# Patient Record
Sex: Female | Born: 1985 | Race: White | Hispanic: No | Marital: Married | Smoking: Never smoker
Health system: Southern US, Community
[De-identification: ages and names within clinical notes are randomized; demographics above are authoritative.]

## PROBLEM LIST (undated history)

## (undated) ENCOUNTER — Emergency Department: Admission: EM | Payer: BLUE CROSS/BLUE SHIELD | Source: Home / Self Care

## (undated) HISTORY — PX: BREAST REDUCTION SURGERY: SHX8

---

## 2008-01-09 ENCOUNTER — Emergency Department (HOSPITAL_COMMUNITY): Admission: EM | Admit: 2008-01-09 | Discharge: 2008-01-09 | Payer: Self-pay | Admitting: Emergency Medicine

## 2009-08-07 ENCOUNTER — Inpatient Hospital Stay (HOSPITAL_COMMUNITY): Admission: RE | Admit: 2009-08-07 | Discharge: 2009-08-10 | Payer: Self-pay | Admitting: Certified Nurse Midwife

## 2009-11-10 ENCOUNTER — Emergency Department (HOSPITAL_COMMUNITY): Admission: EM | Admit: 2009-11-10 | Discharge: 2009-11-11 | Payer: Self-pay | Admitting: Emergency Medicine

## 2010-09-28 LAB — CBC
HCT: 28.3 % — ABNORMAL LOW (ref 36.0–46.0)
HCT: 36.1 % (ref 36.0–46.0)
Hemoglobin: 12.4 g/dL (ref 12.0–15.0)
Hemoglobin: 9.5 g/dL — ABNORMAL LOW (ref 12.0–15.0)
MCV: 91.5 fL (ref 78.0–100.0)
Platelets: 137 10*3/uL — ABNORMAL LOW (ref 150–400)
Platelets: 180 10*3/uL (ref 150–400)
RDW: 13.7 % (ref 11.5–15.5)

## 2010-09-28 LAB — RPR: RPR Ser Ql: NONREACTIVE

## 2010-09-29 LAB — URINALYSIS, ROUTINE W REFLEX MICROSCOPIC
Leukocytes, UA: NEGATIVE
Nitrite: NEGATIVE
Specific Gravity, Urine: 1.027 (ref 1.005–1.030)
Urobilinogen, UA: 0.2 mg/dL (ref 0.0–1.0)
pH: 6 (ref 5.0–8.0)

## 2010-09-29 LAB — CBC
Hemoglobin: 12.2 g/dL (ref 12.0–15.0)
MCHC: 33.1 g/dL (ref 30.0–36.0)
Platelets: 202 10*3/uL (ref 150–400)
WBC: 13.7 10*3/uL — ABNORMAL HIGH (ref 4.0–10.5)

## 2010-09-29 LAB — COMPREHENSIVE METABOLIC PANEL
ALT: 16 U/L (ref 0–35)
Alkaline Phosphatase: 66 U/L (ref 39–117)
CO2: 25 mEq/L (ref 19–32)
Creatinine, Ser: 0.69 mg/dL (ref 0.4–1.2)
GFR calc Af Amer: >60 mL/min (ref >60)
Glucose, Bld: 110 mg/dL — ABNORMAL HIGH (ref 70–99)
Sodium: 139 mEq/L (ref 135–145)
Total Bilirubin: 0.7 mg/dL (ref 0.3–1.2)
Total Protein: 7.6 g/dL (ref 6.0–8.3)

## 2010-09-29 LAB — URINE CULTURE: Colony Count: 9000

## 2010-09-29 LAB — DIFFERENTIAL
Basophils Absolute: 0 10*3/uL (ref 0.0–0.1)
Eosinophils Relative: 0 % (ref 0–5)
Lymphs Abs: 0.5 10*3/uL — ABNORMAL LOW (ref 0.7–4.0)
Monocytes Absolute: 0.4 10*3/uL (ref 0.1–1.0)
Monocytes Relative: 3 % (ref 3–12)
Neutrophils Relative %: 93 % — ABNORMAL HIGH (ref 43–77)

## 2010-09-29 LAB — URINE MICROSCOPIC-ADD ON

## 2011-04-08 LAB — URINE MICROSCOPIC-ADD ON

## 2011-04-08 LAB — URINALYSIS, ROUTINE W REFLEX MICROSCOPIC
Bilirubin Urine: NEGATIVE
Glucose, UA: NEGATIVE
Ketones, ur: NEGATIVE
Protein, ur: NEGATIVE
pH: 6

## 2014-05-21 ENCOUNTER — Other Ambulatory Visit: Payer: Self-pay | Admitting: Orthopaedic Surgery

## 2014-05-21 DIAGNOSIS — M25551 Pain in right hip: Secondary | ICD-10-CM

## 2014-05-31 ENCOUNTER — Ambulatory Visit
Admission: RE | Admit: 2014-05-31 | Discharge: 2014-05-31 | Disposition: A | Discharge status: Home / Self Care | Payer: BC Managed Care – PPO | Source: Ambulatory Visit | Attending: Orthopaedic Surgery | Admitting: Orthopaedic Surgery

## 2014-05-31 DIAGNOSIS — M25551 Pain in right hip: Secondary | ICD-10-CM

## 2014-05-31 MED ORDER — IOHEXOL 180 MG/ML  SOLN
15.0000 mL | Freq: Once | INTRAMUSCULAR | Status: AC | PRN
  Administered 2014-05-31: 15 mL via INTRA_ARTICULAR

## 2015-03-17 ENCOUNTER — Ambulatory Visit
Admission: RE | Admit: 2015-03-17 | Discharge: 2015-03-17 | Disposition: A | Discharge status: Home / Self Care | Payer: BLUE CROSS/BLUE SHIELD | Source: Ambulatory Visit | Attending: Medical | Admitting: Medical

## 2015-03-17 ENCOUNTER — Other Ambulatory Visit: Payer: Self-pay | Admitting: Medical

## 2015-03-17 DIAGNOSIS — R52 Pain, unspecified: Secondary | ICD-10-CM

## 2015-03-17 DIAGNOSIS — M79644 Pain in right finger(s): Secondary | ICD-10-CM | POA: Insufficient documentation

## 2015-10-02 ENCOUNTER — Encounter (HOSPITAL_COMMUNITY): Payer: Self-pay

## 2015-10-02 ENCOUNTER — Emergency Department (HOSPITAL_COMMUNITY)
Admission: EM | Admit: 2015-10-02 | Discharge: 2015-10-02 | Disposition: A | Discharge status: Home / Self Care | Payer: BLUE CROSS/BLUE SHIELD | Attending: Emergency Medicine | Admitting: Emergency Medicine

## 2015-10-02 DIAGNOSIS — Z9889 Other specified postprocedural states: Secondary | ICD-10-CM | POA: Diagnosis not present

## 2015-10-02 DIAGNOSIS — Z7952 Long term (current) use of systemic steroids: Secondary | ICD-10-CM | POA: Diagnosis not present

## 2015-10-02 DIAGNOSIS — R197 Diarrhea, unspecified: Secondary | ICD-10-CM | POA: Diagnosis present

## 2015-10-02 DIAGNOSIS — Z3202 Encounter for pregnancy test, result negative: Secondary | ICD-10-CM | POA: Insufficient documentation

## 2015-10-02 DIAGNOSIS — K529 Noninfective gastroenteritis and colitis, unspecified: Secondary | ICD-10-CM | POA: Diagnosis not present

## 2015-10-02 DIAGNOSIS — Z79899 Other long term (current) drug therapy: Secondary | ICD-10-CM | POA: Insufficient documentation

## 2015-10-02 LAB — URINALYSIS, ROUTINE W REFLEX MICROSCOPIC
BILIRUBIN URINE: NEGATIVE
Glucose, UA: NEGATIVE mg/dL
Ketones, ur: 40 mg/dL — AB
Leukocytes, UA: NEGATIVE
Nitrite: NEGATIVE
Protein, ur: 30 mg/dL — AB
Specific Gravity, Urine: 1.037 — ABNORMAL HIGH (ref 1.005–1.030)
pH: 6 (ref 5.0–8.0)

## 2015-10-02 LAB — COMPREHENSIVE METABOLIC PANEL
ALK PHOS: 49 U/L (ref 38–126)
ALT: 21 U/L (ref 14–54)
AST: 25 U/L (ref 15–41)
Albumin: 5.9 g/dL — ABNORMAL HIGH (ref 3.5–5.0)
Anion gap: 14 (ref 5–15)
BILIRUBIN TOTAL: 1.3 mg/dL — AB (ref 0.3–1.2)
BUN: 17 mg/dL (ref 6–20)
CALCIUM: 10 mg/dL (ref 8.9–10.3)
CO2: 22 mmol/L (ref 22–32)
Chloride: 109 mmol/L (ref 101–111)
Creatinine, Ser: 0.73 mg/dL (ref 0.44–1.00)
GFR calc Af Amer: >60 mL/min (ref >60)
Glucose, Bld: 102 mg/dL — ABNORMAL HIGH (ref 65–99)
POTASSIUM: 4.2 mmol/L (ref 3.5–5.1)
SODIUM: 145 mmol/L (ref 135–145)
TOTAL PROTEIN: 9.1 g/dL — AB (ref 6.5–8.1)

## 2015-10-02 LAB — CBC
HCT: 46.2 % — ABNORMAL HIGH (ref 36.0–46.0)
Hemoglobin: 16.4 g/dL — ABNORMAL HIGH (ref 12.0–15.0)
MCH: 29.5 pg (ref 26.0–34.0)
MCHC: 35.5 g/dL (ref 30.0–36.0)
MCV: 83.1 fL (ref 78.0–100.0)
PLATELETS: 215 10*3/uL (ref 150–400)
RBC: 5.56 MIL/uL — AB (ref 3.87–5.11)
RDW: 12.7 % (ref 11.5–15.5)
WBC: 11.3 10*3/uL — ABNORMAL HIGH (ref 4.0–10.5)

## 2015-10-02 LAB — URINE MICROSCOPIC-ADD ON

## 2015-10-02 LAB — LIPASE, BLOOD: LIPASE: 25 U/L (ref 11–51)

## 2015-10-02 LAB — I-STAT BETA HCG BLOOD, ED (MC, WL, AP ONLY): I-stat hCG, quantitative: <5 m[IU]/mL (ref <5)

## 2015-10-02 MED ORDER — SODIUM CHLORIDE 0.9 % IV BOLUS (SEPSIS)
1000.0000 mL | Freq: Once | INTRAVENOUS | Status: AC
  Administered 2015-10-02: 1000 mL via INTRAVENOUS

## 2015-10-02 MED ORDER — DICYCLOMINE HCL 10 MG PO CAPS
10.0000 mg | ORAL_CAPSULE | Freq: Once | ORAL | Status: AC
  Administered 2015-10-02: 10 mg via ORAL
  Filled 2015-10-02: qty 1

## 2015-10-02 MED ORDER — ONDANSETRON 4 MG PO TBDP
4.0000 mg | ORAL_TABLET | Freq: Once | ORAL | Status: AC
  Administered 2015-10-02: 4 mg via ORAL
  Filled 2015-10-02: qty 1

## 2015-10-02 MED ORDER — METOCLOPRAMIDE HCL 5 MG/ML IJ SOLN
10.0000 mg | Freq: Once | INTRAMUSCULAR | Status: AC
  Administered 2015-10-02: 10 mg via INTRAVENOUS
  Filled 2015-10-02: qty 2

## 2015-10-02 MED ORDER — ONDANSETRON 4 MG PO TBDP: 4.0000 mg | ORAL_TABLET | Freq: Three times a day (TID) | ORAL | Status: DC | PRN

## 2015-10-02 NOTE — Discharge Instructions (Signed)

## 2015-10-02 NOTE — ED Notes (Signed)
Pt given PO challenge of ginger ale. Pt advised to notify RN if she has any more vomiting.

## 2015-10-02 NOTE — ED Notes (Signed)
Pt with diarrhea and emesis since this morning.  Daughter has been sick also earlier in the week.  No fever.  Abdominal cramping

## 2015-10-02 NOTE — ED Provider Notes (Signed)
CSN: [id number]     Arrival date & time 10/02/15  1445 History   First MD Initiated Contact with Patient 10/02/15 1535     Chief Complaint  Patient presents with  . Emesis  . Diarrhea   HPI  Ms. Ratliff is a 30 year old female presenting with emesis and diarrhea. She woke about 5 AM with voluminous, watery stools and vomiting. She has had approximately 12 episodes of vomiting and "even more than that" episodes of diarrhea. She endorses associated diffuse, mild abdominal cramping that has been intermittent over the day. Denies fevers, chills, headaches, dizziness, syncope, URI symptoms, dysuria or vaginal discharge. She notes that her toddler daughter had a "GI bug" a few days ago. She has tried ginger ale and pepto bismol with no relief. Denies recent suspicious food intake or travel. She has no other complaints today.   History reviewed. No pertinent past medical history. Past Surgical History  Procedure Laterality Date  . Breast reduction surgery    . Cesarean section     History reviewed. No pertinent family history. Social History  Substance Use Topics  . Smoking status: Never Smoker   . Smokeless tobacco: None  . Alcohol Use: No   OB History    No data available     Review of Systems  Gastrointestinal: Positive for nausea, vomiting, abdominal pain and diarrhea.  All other systems reviewed and are negative.  Allergies  Review of patient's allergies indicates no known allergies.  Home Medications   Prior to Admission medications   Medication Sig Start Date End Date Taking? Authorizing Provider  Biotin w/ Vitamins C & E (HAIR/SKIN/NAILS PO) Take 2 tablets by mouth daily.   Yes Historical Provider, MD  bismuth subsalicylate (PEPTO BISMOL) 262 MG chewable tablet Chew 524 mg by mouth as needed for diarrhea or loose stools.   Yes Historical Provider, MD  fluticasone (FLONASE) 50 MCG/ACT nasal spray Place 2 sprays into both nostrils daily.   Yes Historical Provider, MD   levonorgestrel (MIRENA) 20 MCG/24HR IUD 1 each by Intrauterine route once.   Yes Historical Provider, MD  ondansetron (ZOFRAN ODT) 4 MG disintegrating tablet Take 1 tablet (4 mg total) by mouth every 8 (eight) hours as needed for nausea or vomiting. 10/02/15   Aleta Manternach, PA-C   BP 114/76 mmHg  Pulse 93  Temp(Src) 97.9 F (36.6 C) (Oral)  Resp 14  SpO2 100% Physical Exam  Constitutional: She appears well-developed and well-nourished. No distress.  Nontoxic appearing  HENT:  Head: Normocephalic and atraumatic.  Eyes: Conjunctivae are normal. Right eye exhibits no discharge. Left eye exhibits no discharge. No scleral icterus.  Neck: Normal range of motion.  Cardiovascular: Normal rate, regular rhythm and normal heart sounds.   Pulmonary/Chest: Effort normal and breath sounds normal. No respiratory distress.  Abdominal: Soft. Normal appearance. Bowel sounds are decreased. There is generalized tenderness. There is no rigidity, no rebound and no guarding.  Musculoskeletal: Normal range of motion.  Neurological: She is alert. Coordination normal.  Skin: Skin is warm and dry.  Psychiatric: She has a normal mood and affect. Her behavior is normal.  Nursing note and vitals reviewed.   ED Course  Procedures (including critical care time) Labs Review Labs Reviewed  COMPREHENSIVE METABOLIC PANEL - Abnormal; Notable for the following:    Glucose, Bld 102 (*)    Total Protein 9.1 (*)    Albumin 5.9 (*)    Total Bilirubin 1.3 (*)    All other components within  normal limits  CBC - Abnormal; Notable for the following:    WBC 11.3 (*)    RBC 5.56 (*)    Hemoglobin 16.4 (*)    HCT 46.2 (*)    All other components within normal limits  URINALYSIS, ROUTINE W REFLEX MICROSCOPIC (NOT AT [facility]) - Abnormal; Notable for the following:    Color, Urine AMBER (*)    APPearance CLOUDY (*)    Specific Gravity, Urine 1.037 (*)    Hgb urine dipstick LARGE (*)    Ketones, ur 40 (*)    Protein, ur  30 (*)    All other components within normal limits  URINE MICROSCOPIC-ADD ON - Abnormal; Notable for the following:    Squamous Epithelial / LPF 6-30 (*)    Bacteria, UA RARE (*)    All other components within normal limits  LIPASE, BLOOD  I-STAT BETA HCG BLOOD, ED ([facility], [facility], [facility] ONLY)    Imaging Review No results found. I have personally reviewed and evaluated these images and lab results as part of my medical decision-making.   EKG Interpretation None      MDM   Final diagnoses:  Gastroenteritis   Patient presenting with abdominal cramping, nausea, vomiting and diarrhea. Symptoms consistent with viral gastroenteritis. Fluid bolus, reglan and bentyl given in ED. Vitals are stable, patient is afebrile and appears in no acute distress. Mucus membranes are moist. Abdomen is soft with mild generalized tenderness. No focal tenderness and no peritoneal signs. Discussed that there is no indication for imaging at this time. Blood work reviewed and largely unremarkable and pt appears to be dehydrated. No further episodes of vomiting in ED. Patient is tolerating PO liquids before discharge. Will discharge home with supportive therapy. Strict return precautions discussed with patient at bedside and given in discharge paperwork. Patient is stable for discharge.     Alveta HeimlichStevi Keondra Haydu, PA-C 10/02/15 2027  Laurence Spatesachel Morgan Little, MD 10/02/15 2155

## 2015-10-02 NOTE — ED Notes (Signed)
Pt has not had anymore episodes of vomiting since the admin of Reglan.Pt states nausea has subsided.

## 2016-06-17 ENCOUNTER — Ambulatory Visit (INDEPENDENT_AMBULATORY_CARE_PROVIDER_SITE_OTHER): Payer: BLUE CROSS/BLUE SHIELD | Admitting: Orthopaedic Surgery

## 2016-06-17 ENCOUNTER — Encounter (INDEPENDENT_AMBULATORY_CARE_PROVIDER_SITE_OTHER): Payer: Self-pay | Admitting: Orthopaedic Surgery

## 2016-06-17 DIAGNOSIS — M7631 Iliotibial band syndrome, right leg: Secondary | ICD-10-CM

## 2016-06-17 DIAGNOSIS — M7061 Trochanteric bursitis, right hip: Secondary | ICD-10-CM | POA: Insufficient documentation

## 2016-06-17 MED ORDER — BUPIVACAINE HCL 0.5 % IJ SOLN
3.0000 mL | INTRAMUSCULAR | Status: AC | PRN
  Administered 2016-06-17: 3 mL via INTRA_ARTICULAR

## 2016-06-17 MED ORDER — LIDOCAINE HCL 1 % IJ SOLN
3.0000 mL | INTRAMUSCULAR | Status: AC | PRN
  Administered 2016-06-17: 3 mL

## 2016-06-17 MED ORDER — METHYLPREDNISOLONE ACETATE 40 MG/ML IJ SUSP
40.0000 mg | INTRAMUSCULAR | Status: AC | PRN
  Administered 2016-06-17: 40 mg via INTRA_ARTICULAR

## 2016-06-17 NOTE — Progress Notes (Signed)
   Office Visit Note   Patient: Kaitlin Bond           Date of Birth: 13-Mar-1986           MRN: [id number] Visit Date: 06/17/2016              Requested by: Doy MinceHeather R Ratcliffe, PA-C [address] [place], [place] [id number] PCP: Wynn BankerHEATHER R RATCLIFFE, PA-C   Assessment & Plan: Visit Diagnoses:  1. It band syndrome, right     Plan: Impression is right hip IT band syndrome. Injection was performed into the IT band and trochanteric bursa under sterile conditions patient tolerated this well I also tolerated stretches to do. Follow-up as needed.  Follow-Up Instructions: Return if symptoms worsen or fail to improve.   Orders:  No orders of the defined types were placed in this encounter.  No orders of the defined types were placed in this encounter.     Procedures: Large Joint Inj Date/Time: 06/17/2016 11:51 AM Performed by: Tarry KosXU, NAIPING M Authorized by: Tarry KosXU, NAIPING M   Consent Given by:  Patient Timeout: prior to procedure the correct patient, procedure, and site was verified   Indications:  Pain Location:  Hip Site:  R greater trochanter Prep: patient was prepped and draped in usual sterile fashion   Needle Size:  22 G Approach:  Lateral Ultrasound Guidance: No   Fluoroscopic Guidance: No   Arthrogram: No   Medications:  3 mL lidocaine 1 %; 3 mL bupivacaine 0.5 %; 40 mg methylPREDNISolone acetate 40 MG/ML     Clinical Data: No additional findings.   Subjective: Chief Complaint  Patient presents with  . Right Hip - Pain    Patient is a 30 year old female who I saw out 2 years ago for right hip IT band syndrome. She had an injection at that time and it helped tremendously. Recently this has been bothering her again. She denies any new symptoms other than lateral hip pain is worse with crossing her legs and activity. She is currently training for a half marathon.    Review of Systems   Objective: Vital Signs: There were no vitals taken for this  visit.  Physical Exam  Ortho Exam  Exam of the right hip shows no pain with rotation of the hip. Lateral greater trochanter is tender to palpation. She has no radicular symptoms. Specialty Comments:  No specialty comments available.  Imaging: No results found.   PMFS History: Patient Active Problem List   Diagnosis Date Noted  . Trochanteric bursitis of right hip 06/17/2016  . It band syndrome, right 06/17/2016   No past medical history on file.  No family history on file.  Past Surgical History:  Procedure Laterality Date  . BREAST REDUCTION SURGERY    . CESAREAN SECTION     Social History   Occupational History  . Not on file.   Social History Main Topics  . Smoking status: Never Smoker  . Smokeless tobacco: Not on file  . Alcohol use No  . Drug use: No  . Sexual activity: Not on file

## 2016-06-23 ENCOUNTER — Telehealth (INDEPENDENT_AMBULATORY_CARE_PROVIDER_SITE_OTHER): Payer: Self-pay | Admitting: Orthopaedic Surgery

## 2016-06-23 NOTE — Telephone Encounter (Signed)
Faxed note.

## 2016-06-23 NOTE — Telephone Encounter (Signed)
Belinda from [facility] thinks we faxed them over office visits of pt to her and she wants to know if we can refax them. Her fax [phone]. Her phone number is  [phone].

## 2017-05-07 ENCOUNTER — Encounter: Payer: Self-pay | Admitting: Emergency Medicine

## 2017-05-07 ENCOUNTER — Emergency Department: Payer: BLUE CROSS/BLUE SHIELD

## 2017-05-07 ENCOUNTER — Emergency Department
Admission: EM | Admit: 2017-05-07 | Discharge: 2017-05-07 | Disposition: A | Discharge status: Home / Self Care | Payer: BLUE CROSS/BLUE SHIELD | Attending: Emergency Medicine | Admitting: Emergency Medicine

## 2017-05-07 DIAGNOSIS — Y929 Unspecified place or not applicable: Secondary | ICD-10-CM | POA: Insufficient documentation

## 2017-05-07 DIAGNOSIS — X58XXXA Exposure to other specified factors, initial encounter: Secondary | ICD-10-CM | POA: Diagnosis not present

## 2017-05-07 DIAGNOSIS — Z79899 Other long term (current) drug therapy: Secondary | ICD-10-CM | POA: Insufficient documentation

## 2017-05-07 DIAGNOSIS — S93491A Sprain of other ligament of right ankle, initial encounter: Secondary | ICD-10-CM | POA: Insufficient documentation

## 2017-05-07 DIAGNOSIS — Y9301 Activity, walking, marching and hiking: Secondary | ICD-10-CM | POA: Insufficient documentation

## 2017-05-07 DIAGNOSIS — Y999 Unspecified external cause status: Secondary | ICD-10-CM | POA: Insufficient documentation

## 2017-05-07 DIAGNOSIS — S99911A Unspecified injury of right ankle, initial encounter: Secondary | ICD-10-CM | POA: Diagnosis present

## 2017-05-07 MED ORDER — MELOXICAM 15 MG PO TABS: 15.0000 mg | ORAL_TABLET | Freq: Every day | ORAL | 1 refills | Status: AC

## 2017-05-07 NOTE — ED Provider Notes (Signed)
[facility] Emergency Department Provider Note  ____________________________________________  Time seen: Approximately 4:24 PM  I have reviewed the triage vital signs and the nursing notes.   HISTORY  Chief Complaint Ankle Pain    HPI Kaitlin Bond is a [age] y.o. female presents to the emergency department with 7 out of 10 right ankle pain.  Patient sustained an inversion type right ankle sprain this morning while at a mud run event.  Patient denies hitting her head or losing consciousness.  She denies weakness, radiculopathy or changes in sensation of the lower extremities.  No skin compromise.  No medications were attempted prior to presenting to the emergency department.   History reviewed. No pertinent past medical history.  Patient Active Problem List   Diagnosis Date Noted  . Trochanteric bursitis of right hip 06/17/2016  . It band syndrome, right 06/17/2016    Past Surgical History:  Procedure Laterality Date  . BREAST REDUCTION SURGERY    . CESAREAN SECTION      Prior to Admission medications   Medication Sig Start Date End Date Taking? Authorizing Provider  Biotin w/ Vitamins C & E (HAIR/SKIN/NAILS PO) Take 2 tablets by mouth daily.    [provider]  bismuth subsalicylate (PEPTO BISMOL) 262 MG chewable tablet Chew 524 mg by mouth as needed for diarrhea or loose stools.    [provider]  fluticasone (FLONASE) 50 MCG/ACT nasal spray Place 2 sprays into both nostrils daily.    [provider]  levonorgestrel (MIRENA) 20 MCG/24HR IUD 1 each by Intrauterine route once.    [provider]  meloxicam (MOBIC) 15 MG tablet Take 1 tablet (15 mg total) by mouth daily. 05/07/17 05/14/17  Orvil FeilWoods, Dhaval Woo M, PA-C  ondansetron (ZOFRAN ODT) 4 MG disintegrating tablet Take 1 tablet (4 mg total) by mouth every 8 (eight) hours as needed for nausea or vomiting. Patient not taking: Reported on 06/17/2016 10/02/15   Barrett, Rolm GalaStevi,  PA-C    Allergies Patient has no known allergies.  No family history on file.  Social History Social History  Substance Use Topics  . Smoking status: Never Smoker  . Smokeless tobacco: Not on file  . Alcohol use No     Review of Systems  Constitutional: No fever/chills Eyes: No visual changes. No discharge ENT: No upper respiratory complaints. Cardiovascular: no chest pain. Respiratory: no cough. No SOB. Musculoskeletal: Patient has right ankle pain Skin: Negative for rash, abrasions, lacerations, ecchymosis. Neurological: Negative for headaches, focal weakness or numbness.   ____________________________________________   PHYSICAL EXAM:  VITAL SIGNS: ED Triage Vitals  Enc Vitals Group     BP 05/07/17 1445 122/85     Pulse Rate 05/07/17 1445 83     Resp 05/07/17 1445 20     Temp 05/07/17 1445 98.4 F (36.9 C)     Temp Source 05/07/17 1445 Oral     SpO2 05/07/17 1445 100 %     Weight 05/07/17 1445 185 lb (83.9 kg)     Height 05/07/17 1445 5\' 8"  (1.727 m)     Head Circumference --      Peak Flow --      Pain Score 05/07/17 1444 7     Pain Loc --      Pain Edu? --      Excl. in GC? --      Constitutional: Alert and oriented. Well appearing and in no acute distress. Eyes: Conjunctivae are normal. PERRL. EOMI. Head: Atraumatic. Cardiovascular: Normal  rate, regular rhythm. Normal S1 and S2.  Good peripheral circulation. Respiratory: Normal respiratory effort without tachypnea or retractions. Lungs CTAB. Good air entry to the bases with no decreased or absent breath sounds. Musculoskeletal: Patient has limited range of motion at the right ankle, likely secondary to pain.  Full range of motion of the right knee.  Patient is able to move all 5 right toes.  No pain is elicited with palpation of the right fibular head.  Palpable dorsalis pedis pulse, right.  Pain is elicited with palpation of the anterior and posterior talofibular ligaments. Neurologic:  Normal speech  and language. No gross focal neurologic deficits are appreciated.  Skin:  Skin is warm, dry and intact. No rash noted. Psychiatric: Mood and affect are normal. Speech and behavior are normal. Patient exhibits appropriate insight and judgement.   ____________________________________________   LABS (all labs ordered are listed, but only abnormal results are displayed)  Labs Reviewed - No data to display ____________________________________________  EKG   ____________________________________________  RADIOLOGY Geraldo Pitter, personally viewed and evaluated these images (plain radiographs) as part of my medical decision making, as well as reviewing the written report by the radiologist.  Dg Ankle Complete Right  Result Date: 05/07/2017 CLINICAL DATA:  Running injury with pain in the posterolateral ankle. EXAM: RIGHT ANKLE - COMPLETE 3+ VIEW COMPARISON:  None. FINDINGS: Negative for a fracture or dislocation in the right ankle. Normal alignment. There may be mild lateral soft tissue swelling. Images of the right foot are unremarkable. IMPRESSION: No acute bone abnormality to the right ankle. Electronically Signed   By: Richarda Overlie M.D.   On: 05/07/2017 15:26    ____________________________________________    PROCEDURES  Procedure(s) performed:    Procedures    Medications - No data to display   ____________________________________________   INITIAL IMPRESSION / ASSESSMENT AND PLAN / ED COURSE  Pertinent labs & imaging results that were available during my care of the patient were reviewed by me and considered in my medical decision making (see chart for details).  Review of the [place] CSRS was performed in accordance of the [facility] prior to dispensing any controlled drugs.     Assessment and plan Right ankle pain Patient presents to the emergency department with right ankle pain.  Differential diagnosis includes sprain versus fracture versus laceration.  Physical exam  findings were pertinent for pain with palpation of the anterior and posterior talofibular ligaments.  X-ray examination revealed no acute fractures or bony abnormalities.  No skin compromise was visualized.  Right ankle sprain is likely.  Crutches were provided in the emergency department.  Patient was discharged with meloxicam.  Rest, ice, compression and elevation were encouraged.  Patient was advised to seek care with referred podiatrist if right ankle pain persists.  All patient questions were answered.   ____________________________________________  FINAL CLINICAL IMPRESSION(S) / ED DIAGNOSES  Final diagnoses:  Sprain of anterior talofibular ligament of right ankle, initial encounter      NEW MEDICATIONS STARTED DURING THIS VISIT:  New Prescriptions   MELOXICAM (MOBIC) 15 MG TABLET    Take 1 tablet (15 mg total) by mouth daily.        This chart was dictated using voice recognition software/Dragon. Despite best efforts to proofread, errors can occur which can change the meaning. Any change was purely unintentional.    Orvil Feil, PA-C 05/07/17 1629

## 2017-05-07 NOTE — ED Triage Notes (Signed)
Twisted ankle and heard pop during mud run.

## 2017-08-17 ENCOUNTER — Ambulatory Visit: Payer: BLUE CROSS/BLUE SHIELD | Admitting: Medical

## 2017-08-17 VITALS — BP 112/75 | HR 68 | Temp 98.3°F | Resp 18 | Ht 68.0 in | Wt 195.6 lb

## 2017-08-17 DIAGNOSIS — J069 Acute upper respiratory infection, unspecified: Secondary | ICD-10-CM

## 2017-08-17 DIAGNOSIS — J029 Acute pharyngitis, unspecified: Secondary | ICD-10-CM

## 2017-08-17 MED ORDER — AMOXICILLIN-POT CLAVULANATE 875-125 MG PO TABS: 1.0000 | ORAL_TABLET | Freq: Two times a day (BID) | ORAL | 0 refills | Status: DC

## 2017-08-17 NOTE — Progress Notes (Signed)
   Subjective:    Patient ID: Kaitlin Bond, female    DOB: Oct 14, 1985, 32 y.o.   MRN: [id number]  HPI  32 yo female in non acute distress. Started on Sunday with sore throat and neck lymph nodes swollen. Nasal congestion bloody  With blowing nose in shower otherwise clear.. Denies Fever and chills but feels tired. Feels like infection is in her chest.cough non productive.sore throat continues since Sunday.   Review of Systems  Constitutional: Positive for fatigue. Negative for chills and fever.  HENT: Positive for congestion, postnasal drip, sneezing and sore throat (still). Negative for rhinorrhea, sinus pressure and sinus pain.   Eyes: Negative for discharge and itching.  Respiratory: Positive for cough (mild) and chest tightness (mild). Negative for shortness of breath.   Cardiovascular: Negative for chest pain.  Gastrointestinal: Negative for abdominal pain.  Genitourinary: Negative for dysuria.  Musculoskeletal: Negative for myalgias.  Skin: Negative for rash.  Allergic/Immunologic: Positive for environmental allergies and food allergies (gluten intolerance).  Neurological: Negative for dizziness, syncope, light-headedness and headaches.  Hematological: Positive for adenopathy.  Psychiatric/Behavioral: Negative for behavioral problems, self-injury and suicidal ideas. The patient is not nervous/anxious.        Objective:   Physical Exam  Constitutional: She is oriented to person, place, and time. She appears well-developed and well-nourished.  HENT:  Head: Normocephalic and atraumatic.  Right Ear: Hearing, external ear and ear canal normal. A middle ear effusion is present.  Left Ear: Hearing, external ear and ear canal normal. A middle ear effusion is present.  Nose: Rhinorrhea present. No mucosal edema.  Mouth/Throat: Uvula is midline and mucous membranes are normal. Posterior oropharyngeal erythema present. No oropharyngeal exudate or posterior oropharyngeal edema.  Eyes:  Conjunctivae and EOM are normal. Pupils are equal, round, and reactive to light.  Neck: Normal range of motion.  Cardiovascular: Normal rate, regular rhythm and normal heart sounds.  Pulmonary/Chest: Effort normal and breath sounds normal. No respiratory distress. She has no wheezes. She has no rales.  Lymphadenopathy:    She has no cervical adenopathy.  Neurological: She is alert and oriented to person, place, and time.  Skin: Skin is warm and dry.  Psychiatric: She has a normal mood and affect. Her behavior is normal. Judgment and thought content normal.  Nursing note and vitals reviewed.   Able to take deep breaths without difficulty.       Assessment & Plan:  Upper respiratory , cough pharyngitis take soft foods. OTC Zyrtec or Claritin take as directed. OTC Mortin or Tylenol take as directed. Rest and increase fluids. Meds ordered this encounter  Medications  . amoxicillin-clavulanate (AUGMENTIN) 875-125 MG tablet    Sig: Take 1 tablet by mouth 2 (two) times daily.    Dispense:  20 tablet    Refill:  0  Return in 3-5 days if not improving. Patient verbalizes understanding and has no questions at discharge.

## 2017-08-17 NOTE — Patient Instructions (Signed)
Pharyngitis Pharyngitis is a sore throat (pharynx). There is redness, pain, and swelling of your throat. Follow these instructions at home:  Drink enough fluids to keep your pee (urine) clear or pale yellow.  Only take medicine as told by your doctor. ? You may get sick again if you do not take medicine as told. Finish your medicines, even if you start to feel better. ? Do not take aspirin.  Rest.  Rinse your mouth (gargle) with salt water ( tsp of salt per 1 qt of water) every 1-2 hours. This will help the pain.  If you are not at risk for choking, you can suck on hard candy or sore throat lozenges. Contact a doctor if:  You have large, tender lumps on your neck.  You have a rash.  You cough up green, yellow-brown, or bloody spit. Get help right away if:  You have a stiff neck.  You drool or cannot swallow liquids.  You throw up (vomit) or are not able to keep medicine or liquids down.  You have very bad pain that does not go away with medicine.  You have problems breathing (not from a stuffy nose). This information is not intended to replace advice given to you by your health care provider. Make sure you discuss any questions you have with your health care provider. Document Released: 12/15/2007 Document Revised: 12/04/2015 Document Reviewed: 03/05/2013 Elsevier Interactive Patient Education  2017 [employer] Upper Respiratory Infection, Adult Most upper respiratory infections (URIs) are caused by a virus. A URI affects the nose, throat, and upper air passages. The most common type of URI is often called "the common cold." Follow these instructions at home:  Take medicines only as told by your doctor.  Gargle warm saltwater or take cough drops to comfort your throat as told by your doctor.  Use a warm mist humidifier or inhale steam from a shower to increase air moisture. This may make it easier to breathe.  Drink enough fluid to keep your pee (urine) clear or pale  yellow.  Eat soups and other clear broths.  Have a healthy diet.  Rest as needed.  Go back to work when your fever is gone or your doctor says it is okay. ? You may need to stay home longer to avoid giving your URI to others. ? You can also wear a face mask and wash your hands often to prevent spread of the virus.  Use your inhaler more if you have asthma.  Do not use any tobacco products, including cigarettes, chewing tobacco, or electronic cigarettes. If you need help quitting, ask your doctor. Contact a doctor if:  You are getting worse, not better.  Your symptoms are not helped by medicine.  You have chills.  You are getting more short of breath.  You have brown or red mucus.  You have yellow or brown discharge from your nose.  You have pain in your face, especially when you bend forward.  You have a fever.  You have puffy (swollen) neck glands.  You have pain while swallowing.  You have white areas in the back of your throat. Get help right away if:  You have very bad or constant: ? Headache. ? Ear pain. ? Pain in your forehead, behind your eyes, and over your cheekbones (sinus pain). ? Chest pain.  You have long-lasting (chronic) lung disease and any of the following: ? Wheezing. ? Long-lasting cough. ? Coughing up blood. ? A change in your usual mucus.  You have a stiff neck.  You have changes in your: ? Vision. ? Hearing. ? Thinking. ? Mood. This information is not intended to replace advice given to you by your health care provider. Make sure you discuss any questions you have with your health care provider. Document Released: 12/15/2007 Document Revised: 02/29/2016 Document Reviewed: 10/03/2013 Elsevier Interactive Patient Education  2018 [employer].

## 2018-02-15 ENCOUNTER — Ambulatory Visit: Payer: BLUE CROSS/BLUE SHIELD | Admitting: Adult Health

## 2018-02-15 VITALS — BP 143/71 | HR 80 | Temp 99.2°F | Resp 16 | Ht 68.0 in | Wt 198.0 lb

## 2018-02-15 DIAGNOSIS — H6502 Acute serous otitis media, left ear: Secondary | ICD-10-CM

## 2018-02-15 DIAGNOSIS — J029 Acute pharyngitis, unspecified: Secondary | ICD-10-CM

## 2018-02-15 MED ORDER — AMOXICILLIN 875 MG PO TABS: 875.0000 mg | ORAL_TABLET | Freq: Two times a day (BID) | ORAL | 0 refills | Status: DC

## 2018-02-15 NOTE — Progress Notes (Signed)
Subjective:     Patient ID: Kaitlin Bond, female   DOB: 04-Aug-1985, 32 y.o.   MRN: [id number]  Ear Fullness   There is pain in the left ear. This is a new problem. The current episode started in the past 7 days (1 week ago ). The problem occurs constantly. There has been no fever. Pertinent negatives include no abdominal pain, coughing, diarrhea, ear discharge, headaches, hearing loss, neck pain, rash, rhinorrhea, sore throat or vomiting. She has tried NSAIDs (tylenol sinus ) for the symptoms. The treatment provided no relief. There is no history of a chronic ear infection, hearing loss or a tympanostomy tube.    Blood pressure (!) 143/71, pulse 80, temperature 99.2 F (37.3 C), resp. rate 16, height 5\' 8"  (1.727 m), weight 198 lb (89.8 kg), SpO2 100 %. Patient is a 32 year old female in no acute distress who comes to the clinic with ear fluid, pain in left ear described as above.   Allergies  Allergen Reactions  . Azithromycin     Makes patient "feel funny", strange feeling.  . Other     Gluten intolerance   No LMP recorded. (Menstrual status: IUD). Denies any chance of pregnancy.   Review of Systems  Constitutional: Negative.   HENT: Positive for ear pain and postnasal drip. Negative for congestion, dental problem, drooling, ear discharge, facial swelling, hearing loss, mouth sores, nosebleeds, rhinorrhea, sinus pain, sneezing, sore throat, tinnitus, trouble swallowing and voice change.   Eyes: Negative.   Respiratory: Negative.  Negative for cough.   Cardiovascular: Negative.   Gastrointestinal: Negative.  Negative for abdominal pain, diarrhea and vomiting.  Endocrine: Negative.   Genitourinary: Negative.   Musculoskeletal: Negative.  Negative for neck pain.  Skin: Negative.  Negative for rash.  Allergic/Immunologic:        -- Azithromycin    --  Makes patient "feel funny", strange feeling.  -- Other    --  Gluten intolerance   Neurological: Negative.  Negative for  headaches.  Hematological: Negative.  Negative for adenopathy.  Psychiatric/Behavioral: Negative.        Objective:   Physical Exam  Constitutional: She is oriented to person, place, and time. Vital signs are normal. She appears well-developed and well-nourished. She is active.  Non-toxic appearance. She does not have a sickly appearance. She does not appear ill. No distress.  Patient is alert and oriented and responsive to questions Engages in eye contact with provider. Speaks in full sentences without any pauses without any shortness of breath or distress.    HENT:  Head: Normocephalic and atraumatic.  Right Ear: Hearing, external ear and ear canal normal. Tympanic membrane is not perforated and not erythematous. A middle ear effusion is present.  Left Ear: Hearing, external ear and ear canal normal. Tympanic membrane is erythematous. Tympanic membrane is not perforated. A middle ear effusion is present.  Nose: Nose normal. No mucosal edema or rhinorrhea. Right sinus exhibits no maxillary sinus tenderness and no frontal sinus tenderness. Left sinus exhibits no maxillary sinus tenderness and no frontal sinus tenderness.  Mouth/Throat: Uvula is midline and mucous membranes are normal. Oropharyngeal exudate (mild white exudate bilterally ) present. No posterior oropharyngeal edema, posterior oropharyngeal erythema or tonsillar abscesses.  Eyes: Pupils are equal, round, and reactive to light. Conjunctivae, EOM and lids are normal. Right eye exhibits no discharge. Left eye exhibits no discharge. No scleral icterus.  Neck: Normal range of motion. Neck supple. No JVD present. No tracheal deviation present.  Cardiovascular: Normal rate, regular rhythm, normal heart sounds and intact distal pulses. Exam reveals no gallop and no friction rub.  No murmur heard. Pulmonary/Chest: Effort normal and breath sounds normal. No stridor. No respiratory distress. She has no wheezes. She has no rales. She exhibits  no tenderness.  Abdominal: Soft. Bowel sounds are normal.  Musculoskeletal: Normal range of motion.  Lymphadenopathy:       Head (right side): No submental, no submandibular, no tonsillar, no preauricular, no posterior auricular and no occipital adenopathy present.       Head (left side): No submental, no submandibular, no tonsillar, no preauricular, no posterior auricular and no occipital adenopathy present.    She has no cervical adenopathy.  Neurological: She is alert and oriented to person, place, and time. She has normal strength. She displays normal reflexes. No cranial nerve deficit or sensory deficit. She exhibits normal muscle tone. Coordination normal.  Patient moves on and off of exam table and in room without difficulty. Gait is normal in hall and in room. Patient is oriented to person place time and situation. Patient answers questions appropriately and engages in conversation.   Skin: Skin is warm, dry and intact. Capillary refill takes less than 2 seconds. No rash noted. She is not diaphoretic. No erythema. No pallor. Nails show no clubbing.  Psychiatric: She has a normal mood and affect. Her speech is normal and behavior is normal. Judgment and thought content normal. Cognition and memory are normal.  Vitals reviewed.      Assessment:     Non-recurrent acute serous otitis media of left ear      Plan:      Meds ordered this encounter  Medications  . amoxicillin (AMOXIL) 875 MG tablet    Sig: Take 1 tablet (875 mg total) by mouth 2 (two) times daily.    Dispense:  20 tablet    Refill:  0   Continue Zyrtec daily for allergies.    Advised patient call the office or your primary care doctor for an appointment if no improvement within 72 hours or if any symptoms change or worsen at any time  Advised ER or urgent Care if after hours or on weekend. Call 911 for emergency symptoms at any time.Patinet verbalized understanding of all instructions given/reviewed and treatment  plan and has no further questions or concerns at this time.    Patient verbalized understanding of all instructions given and denies any further questions at this time.

## 2018-02-15 NOTE — Patient Instructions (Addendum)
Amoxicillin capsules or tablets What is this medicine? AMOXICILLIN (a mox i SIL in) is a penicillin antibiotic. It is used to treat certain kinds of bacterial infections. It will not work for colds, flu, or other viral infections. This medicine may be used for other purposes; ask your health care provider or pharmacist if you have questions. COMMON BRAND NAME(S): Amoxil, Moxilin, Sumox, Trimox What should I tell my health care provider before I take this medicine? They need to know if you have any of these conditions: -asthma -kidney disease -an unusual or allergic reaction to amoxicillin, other penicillins, cephalosporin antibiotics, other medicines, foods, dyes, or preservatives -pregnant or trying to get pregnant -breast-feeding How should I use this medicine? Take this medicine by mouth with a glass of water. Follow the directions on your prescription label. You may take this medicine with food or on an empty stomach. Take your medicine at regular intervals. Do not take your medicine more often than directed. Take all of your medicine as directed even if you think your are better. Do not skip doses or stop your medicine early. Talk to your pediatrician regarding the use of this medicine in children. While this drug may be prescribed for selected conditions, precautions do apply. Overdosage: If you think you have taken too much of this medicine contact a poison control center or emergency room at once. NOTE: This medicine is only for you. Do not share this medicine with others. What if I miss a dose? If you miss a dose, take it as soon as you can. If it is almost time for your next dose, take only that dose. Do not take double or extra doses. What may interact with this medicine? -amiloride -birth control pills -chloramphenicol -macrolides -probenecid -sulfonamides -tetracyclines This list may not describe all possible interactions. Give your health care provider a list of all the  medicines, herbs, non-prescription drugs, or dietary supplements you use. Also tell them if you smoke, drink alcohol, or use illegal drugs. Some items may interact with your medicine. What should I watch for while using this medicine? Tell your doctor or health care professional if your symptoms do not improve in 2 or 3 days. Take all of the doses of your medicine as directed. Do not skip doses or stop your medicine early. If you are diabetic, you may get a false positive result for sugar in your urine with certain brands of urine tests. Check with your doctor. Do not treat diarrhea with over-the-counter products. Contact your doctor if you have diarrhea that lasts more than 2 days or if the diarrhea is severe and watery. What side effects may I notice from receiving this medicine? Side effects that you should report to your doctor or health care professional as soon as possible: -allergic reactions like skin rash, itching or hives, swelling of the face, lips, or tongue -breathing problems -dark urine -redness, blistering, peeling or loosening of the skin, including inside the mouth -seizures -severe or watery diarrhea -trouble passing urine or change in the amount of urine -unusual bleeding or bruising -unusually weak or tired -yellowing of the eyes or skin Side effects that usually do not require medical attention (report to your doctor or health care professional if they continue or are bothersome): -dizziness -headache -stomach upset -trouble sleeping This list may not describe all possible side effects. Call your doctor for medical advice about side effects. You may report side effects to FDA at 1-800-FDA-1088. Where should I keep my medicine? Keep out   of the reach of children. Store between 68 and 77 degrees F (20 and 25 degrees C). Keep bottle closed tightly. Throw away any unused medicine after the expiration date. NOTE: This sheet is a summary. It may not cover all possible  information. If you have questions about this medicine, talk to your doctor, pharmacist, or health care provider.  2018 Elsevier/Gold Standard (2007-09-19 14:10:59) Otitis Media, Adult Otitis media is redness, soreness, and puffiness (swelling) in the space just behind your eardrum (middle ear). It may be caused by allergies or infection. It often happens along with a cold. Follow these instructions at home:  Take your medicine as told. Finish it even if you start to feel better.  Only take over-the-counter or prescription medicines for pain, discomfort, or fever as told by your doctor.  Follow up with your doctor as told. Contact a doctor if:  You have otitis media only in one ear, or bleeding from your nose, or both.  You notice a lump on your neck.  You are not getting better in 3-5 days.  You feel worse instead of better. Get help right away if:  You have pain that is not helped with medicine.  You have puffiness, redness, or pain around your ear.  You get a stiff neck.  You cannot move part of your face (paralysis).  You notice that the bone behind your ear hurts when you touch it. This information is not intended to replace advice given to you by your health care provider. Make sure you discuss any questions you have with your health care provider. Document Released: 12/15/2007 Document Revised: 12/04/2015 Document Reviewed: 01/23/2013 Elsevier Interactive Patient Education  2017 Elsevier Inc.    Pharyngitis Pharyngitis is a sore throat (pharynx). There is redness, pain, and swelling of your throat. Follow these instructions at home:  Drink enough fluids to keep your pee (urine) clear or pale yellow.  Only take medicine as told by your doctor. ? You may get sick again if you do not take medicine as told. Finish your medicines, even if you start to feel better. ? Do not take aspirin.  Rest.  Rinse your mouth (gargle) with salt water ( tsp of salt per 1 qt of  water) every 1-2 hours. This will help the pain.  If you are not at risk for choking, you can suck on hard candy or sore throat lozenges. Contact a doctor if:  You have large, tender lumps on your neck.  You have a rash.  You cough up green, yellow-brown, or bloody spit. Get help right away if:  You have a stiff neck.  You drool or cannot swallow liquids.  You throw up (vomit) or are not able to keep medicine or liquids down.  You have very bad pain that does not go away with medicine.  You have problems breathing (not from a stuffy nose). This information is not intended to replace advice given to you by your health care provider. Make sure you discuss any questions you have with your health care provider. Document Released: 12/15/2007 Document Revised: 12/04/2015 Document Reviewed: 03/05/2013 Elsevier Interactive Patient Education  2017 [employer]

## 2018-09-19 ENCOUNTER — Ambulatory Visit (INDEPENDENT_AMBULATORY_CARE_PROVIDER_SITE_OTHER): Payer: BLUE CROSS/BLUE SHIELD | Admitting: Certified Nurse Midwife

## 2018-09-19 ENCOUNTER — Encounter: Payer: Self-pay | Admitting: Certified Nurse Midwife

## 2018-09-19 ENCOUNTER — Other Ambulatory Visit (HOSPITAL_COMMUNITY)
Admission: RE | Admit: 2018-09-19 | Discharge: 2018-09-19 | Disposition: A | Discharge status: Home / Self Care | Payer: BLUE CROSS/BLUE SHIELD | Source: Ambulatory Visit | Attending: Certified Nurse Midwife | Admitting: Certified Nurse Midwife

## 2018-09-19 VITALS — BP 117/66 | HR 65 | Ht 68.0 in | Wt 196.5 lb

## 2018-09-19 DIAGNOSIS — Z124 Encounter for screening for malignant neoplasm of cervix: Secondary | ICD-10-CM

## 2018-09-19 DIAGNOSIS — Z803 Family history of malignant neoplasm of breast: Secondary | ICD-10-CM | POA: Insufficient documentation

## 2018-09-19 DIAGNOSIS — T8332XA Displacement of intrauterine contraceptive device, initial encounter: Secondary | ICD-10-CM

## 2018-09-19 DIAGNOSIS — N921 Excessive and frequent menstruation with irregular cycle: Secondary | ICD-10-CM

## 2018-09-19 DIAGNOSIS — Z01419 Encounter for gynecological examination (general) (routine) without abnormal findings: Secondary | ICD-10-CM

## 2018-09-19 DIAGNOSIS — R635 Abnormal weight gain: Secondary | ICD-10-CM

## 2018-09-19 DIAGNOSIS — Z975 Presence of (intrauterine) contraceptive device: Secondary | ICD-10-CM

## 2018-09-19 NOTE — Progress Notes (Signed)
ANNUAL PREVENTATIVE CARE GYN  ENCOUNTER NOTE  Subjective:       Kaitlin Bond is a 33 y.o. G48P1001 female here for a routine annual gynecologic exam.  Current complaints: 1. Breakthrough bleeding with IUD  Reports intermittent spotting and abdominal crapming since 09/02/2018. Vaginal bleeding increased and required tampon use x 1 day. Second time having a Mirena. No menses with previous use.   Does not check for strings. Strings were "tucked by provider" after husband reports "pain with intercourse".   Started exercising and made dietary changes over the last few months, questions if reason for bleeding. Notes she still has "ten pounds of baby weight" and child was born in 2011.  Denies difficulty breathing or respiratory distress, chest pain, excessive vaginal bleeding, dysuria, and leg pain or swelling.   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    Gynecologic History  Patient's last menstrual period was 09/02/2018 (exact date).  Contraception: IUD, Mirena replaced in 2017  Last Pap: due.   Obstetric History  OB History  Gravida Para Term Preterm AB Living  1 1 1     1   SAB TAB Ectopic Multiple Live Births          1    # Outcome Date GA Lbr Len/2nd Weight Sex Delivery Anes PTL Lv  1 Term 2011   8 lb 14 oz (4.026 kg) F CS-Unspec   LIV    No past medical history on file.  Past Surgical History:  Procedure Laterality Date  . BREAST REDUCTION SURGERY    . CESAREAN SECTION      Current Outpatient Medications on File Prior to Visit  Medication Sig Dispense Refill  . Biotin w/ Vitamins C & E (HAIR/SKIN/NAILS PO) Take 2 tablets by mouth daily.    . Homeopathic Products (ZICAM COLD REMEDY PO) Take by mouth.    . levonorgestrel (MIRENA) 20 MCG/24HR IUD 1 each by Intrauterine route once.     No current facility-administered medications on file prior to visit.     Allergies  Allergen Reactions  . Azithromycin     Makes patient "feel funny", strange feeling.  .  Other     Gluten intolerance    Social History   Socioeconomic History  . Marital status: Married    Spouse name: Not on file  . Number of children: Not on file  . Years of education: Not on file  . Highest education level: Not on file  Occupational History  . Not on file  Social Needs  . Financial resource strain: Not on file  . Food insecurity:    Worry: Not on file    Inability: Not on file  . Transportation needs:    Medical: Not on file    Non-medical: Not on file  Tobacco Use  . Smoking status: Never Smoker  . Smokeless tobacco: Never Used  Substance and Sexual Activity  . Alcohol use: No  . Drug use: No  . Sexual activity: Yes    Birth control/protection: I.U.D.  Lifestyle  . Physical activity:    Days per week: Not on file    Minutes per session: Not on file  . Stress: Not on file  Relationships  . Social connections:    Talks on phone: Not on file    Gets together: Not on file    Attends religious service: Not on file    Active member of club or organization: Not on file    Attends meetings of clubs  or organizations: Not on file    Relationship status: Not on file  . Intimate partner violence:    Fear of current or ex partner: Not on file    Emotionally abused: Not on file    Physically abused: Not on file    Forced sexual activity: Not on file  Other Topics Concern  . Not on file  Social History Narrative  . Not on file    Family History  Problem Relation Age of Onset  . Breast cancer Mother   . Breast cancer Maternal Grandmother   . Cancer Paternal Grandfather     The following portions of the patient's history were reviewed and updated as appropriate: allergies, current medications, past family history, past medical history, past social history, past surgical history and problem list.  Review of Systems  ROS negative except as noted above. Information obtained from patient.    Objective:   BP 117/66   Pulse 65   Ht  (1.727 m)    Wt 196 lb 8 oz (89.1 kg)   LMP 09/02/2018 (Exact Date)   BMI 29.88 kg/m    CONSTITUTIONAL: Well-developed, well-nourished female in no acute distress.   PSYCHIATRIC: Normal mood and affect. Normal behavior. Normal judgment and thought content.  NEUROLGIC: Alert and oriented to person, place, and time. Normal muscle tone coordination. No cranial nerve deficit noted.  HENT:  Normocephalic, atraumatic, External right and left ear normal.   EYES: Conjunctivae and EOM are normal. Pupils are equal and round.   NECK: Normal range of motion, supple, no masses.  Normal thyroid.   SKIN: Skin is warm and dry. No rash noted. Not diaphoretic. No erythema. No pallor.  CARDIOVASCULAR: Normal heart rate noted, regular rhythm, no murmur.  RESPIRATORY: Clear to auscultation bilaterally. Effort and breath sounds normal, no problems with respiration noted.  BREASTS: Symmetric in size. No masses,  nipple drainage, or lymphadenopathy. Scars and scar tissue present from bilateral breast reduction.   ABDOMEN: Soft, normal bowel sounds, no distention noted.  No tenderness, rebound or guarding.   PELVIC:  External Genitalia: Normal  Vagina: Normal  Cervix: Normal  Uterus: Normal  Adnexa: Normal   MUSCULOSKELETAL: Normal range of motion. No tenderness.  No cyanosis, clubbing, or edema.  2+ distal pulses.  LYMPHATIC: No Axillary, Supraclavicular, or Inguinal Adenopathy.  Assessment:   Annual gynecologic examination 33 y.o.   Contraception: IUD, Mirena   Overweight   Problem List Items Addressed This Visit      Other   Family history of breast cancer in mother    Other Visit Diagnoses    Well woman exam    -  Primary   Relevant Orders   Cytology - PAP   US PELVIS TRANSVANGINAL NON-OB (TV ONLY)   CBC   Comprehensive metabolic panel   Lipid panel   TSH   Screening for cervical cancer       Relevant Orders   Cytology - PAP   Breakthrough bleeding associated with intrauterine device  (IUD)       Relevant Orders   US PELVIS TRANSVANGINAL NON-OB (TV ONLY)   Intrauterine contraceptive device threads lost, initial encounter       Relevant Orders   US PELVIS TRANSVANGINAL NON-OB (TV ONLY)   Weight gain       Relevant Orders   Lipid panel   TSH      Plan:   Pap: Pap Co Test; see orders  Labs: See orders   Information given  regarding genetic screening for breast and gynecologic cancer; patient will contact if screening is desired  Unable to visualize IUD strings. Will schedule ultrasound, see orders   Routine preventative health maintenance measures emphasized: Exercise/Diet/Weight control, Tobacco Warnings, Alcohol/Substance use risks and Stress Management; see AVS  Reviewed red flag symptoms and when to call  RTC x 1 year for ANNUAL EXAM or sooner if needed   Gunnar Bulla, CNM [facility], [facility]

## 2018-09-19 NOTE — Patient Instructions (Signed)
WE WOULD LOVE TO HEAR FROM YOU!!!!   Thank you Brinae P Corkern for visiting [facility].  Providing our patients with the best experience possible is really important to [place], and we hope that you felt that on your recent visit. The most valuable feedback we get comes from Kaitlin Bond!!    If you receive a survey please take a couple of minutes to let us know how we did.Thank you for continuing to trust [place] with your care.   [facility]   Levonorgestrel intrauterine device (IUD) What is this medicine? LEVONORGESTREL IUD (LEE voe nor jes trel) is a contraceptive (birth control) device. The device is placed inside the uterus by a healthcare professional. It is used to prevent pregnancy. This device can also be used to treat heavy bleeding that occurs during your period. This medicine may be used for other purposes; ask your health care provider or pharmacist if you have questions. COMMON BRAND NAME(S): Minette Headland What should I tell my health care provider before I take this medicine? They need to know if you have any of these conditions: -abnormal Pap smear -cancer of the breast, uterus, or cervix -diabetes -endometritis -genital or pelvic infection now or in the past -have more than one sexual partner or your partner has more than one partner -heart disease -history of an ectopic or tubal pregnancy -immune system problems -IUD in place -liver disease or tumor -problems with blood clots or take blood-thinners -seizures -use intravenous drugs -uterus of unusual shape -vaginal bleeding that has not been explained -an unusual or allergic reaction to levonorgestrel, other hormones, silicone, or polyethylene, medicines, foods, dyes, or preservatives -pregnant or trying to get pregnant -breast-feeding How should I use this medicine? This device is placed inside the uterus by a health care professional. Talk to your pediatrician  regarding the use of this medicine in children. Special care may be needed. Overdosage: If you think you have taken too much of this medicine contact a poison control center or emergency room at once. NOTE: This medicine is only for you. Do not share this medicine with others. What if I miss a dose? This does not apply. Depending on the brand of device you have inserted, the device will need to be replaced every 3 to 5 years if you wish to continue using this type of birth control. What may interact with this medicine? Do not take this medicine with any of the following medications: -amprenavir -bosentan -fosamprenavir This medicine may also interact with the following medications: -aprepitant -armodafinil -barbiturate medicines for inducing sleep or treating seizures -bexarotene -boceprevir -griseofulvin -medicines to treat seizures like carbamazepine, ethotoin, felbamate, oxcarbazepine, phenytoin, topiramate -modafinil -pioglitazone -rifabutin -rifampin -rifapentine -some medicines to treat HIV infection like atazanavir, efavirenz, indinavir, lopinavir, nelfinavir, tipranavir, ritonavir -St. John's wort -warfarin This list may not describe all possible interactions. Give your health care provider a list of all the medicines, herbs, non-prescription drugs, or dietary supplements you use. Also tell them if you smoke, drink alcohol, or use illegal drugs. Some items may interact with your medicine. What should I watch for while using this medicine? Visit your doctor or health care professional for regular check ups. See your doctor if you or your partner has sexual contact with others, becomes HIV positive, or gets a sexual transmitted disease. This product does not protect you against HIV infection (AIDS) or other sexually transmitted diseases. You can check the placement of the IUD yourself by reaching up to the  top of your vagina with clean fingers to feel the threads. Do not pull on  the threads. It is a good habit to check placement after each menstrual period. Call your doctor right away if you feel more of the IUD than just the threads or if you cannot feel the threads at all. The IUD may come out by itself. You may become pregnant if the device comes out. If you notice that the IUD has come out use a backup birth control method like condoms and call your health care provider. Using tampons will not change the position of the IUD and are okay to use during your period. This IUD can be safely scanned with magnetic resonance imaging (MRI) only under specific conditions. Before you have an MRI, tell your healthcare provider that you have an IUD in place, and which type of IUD you have in place. What side effects may I notice from receiving this medicine? Side effects that you should report to your doctor or health care professional as soon as possible: -allergic reactions like skin rash, itching or hives, swelling of the face, lips, or tongue -fever, flu-like symptoms -genital sores -high blood pressure -no menstrual period for 6 weeks during use -pain, swelling, warmth in the leg -pelvic pain or tenderness -severe or sudden headache -signs of pregnancy -stomach cramping -sudden shortness of breath -trouble with balance, talking, or walking -unusual vaginal bleeding, discharge -yellowing of the eyes or skin Side effects that usually do not require medical attention (report to your doctor or health care professional if they continue or are bothersome): -acne -breast pain -change in sex drive or performance -changes in weight -cramping, dizziness, or faintness while the device is being inserted -headache -irregular menstrual bleeding within first 3 to 6 months of use -nausea This list may not describe all possible side effects. Call your doctor for medical advice about side effects. You may report side effects to FDA at 1-800-FDA-1088. Where should I keep my  medicine? This does not apply. NOTE: This sheet is a summary. It may not cover all possible information. If you have questions about this medicine, talk to your doctor, pharmacist, or health care provider.  2019 Elsevier/Gold Standard (2016-04-09 14:14:56) Preventive Care 18-39 Years, Female Preventive care refers to lifestyle choices and visits with your health care provider that can promote health and wellness. What does preventive care include?   A yearly physical exam. This is also called an annual well check.  Dental exams once or twice a year.  Routine eye exams. Ask your health care provider how often you should have your eyes checked.  Personal lifestyle choices, including: ? Daily care of your teeth and gums. ? Regular physical activity. ? Eating a healthy diet. ? Avoiding tobacco and drug use. ? Limiting alcohol use. ? Practicing safe sex. ? Taking vitamin and mineral supplements as recommended by your health care provider. What happens during an annual well check? The services and screenings done by your health care provider during your annual well check will depend on your age, overall health, lifestyle risk factors, and family history of disease. Counseling Your health care provider may ask you questions about your:  Alcohol use.  Tobacco use.  Drug use.  Emotional well-being.  Home and relationship well-being.  Sexual activity.  Eating habits.  Work and work Statistician.  Method of birth control.  Menstrual cycle.  Pregnancy history. Screening You may have the following tests or measurements:  Height, weight, and BMI.  Diabetes  screening. This is done by checking your blood sugar (glucose) after you have not eaten for a while (fasting).  Blood pressure.  Lipid and cholesterol levels. These may be checked every 5 years starting at age 12.  Skin check.  Hepatitis C blood test.  Hepatitis B blood test.  Sexually transmitted disease (STD)  testing.  BRCA-related cancer screening. This may be done if you have a family history of breast, ovarian, tubal, or peritoneal cancers.  Pelvic exam and Pap test. This may be done every 3 years starting at age 34. Starting at age 83, this may be done every 5 years if you have a Pap test in combination with an HPV test. Discuss your test results, treatment options, and if necessary, the need for more tests with your health care provider. Vaccines Your health care provider may recommend certain vaccines, such as:  Influenza vaccine. This is recommended every year.  Tetanus, diphtheria, and acellular pertussis (Tdap, Td) vaccine. You may need a Td booster every 10 years.  Varicella vaccine. You may need this if you have not been vaccinated.  HPV vaccine. If you are 51 or younger, you may need three doses over 6 months.  Measles, mumps, and rubella (MMR) vaccine. You may need at least one dose of MMR. You may also need a second dose.  Pneumococcal 13-valent conjugate (PCV13) vaccine. You may need this if you have certain conditions and were not previously vaccinated.  Pneumococcal polysaccharide (PPSV23) vaccine. You may need one or two doses if you smoke cigarettes or if you have certain conditions.  Meningococcal vaccine. One dose is recommended if you are age 8-21 years and a first-year college student living in a residence hall, or if you have one of several medical conditions. You may also need additional booster doses.  Hepatitis A vaccine. You may need this if you have certain conditions or if you travel or work in places where you may be exposed to hepatitis A.  Hepatitis B vaccine. You may need this if you have certain conditions or if you travel or work in places where you may be exposed to hepatitis B.  Haemophilus influenzae type b (Hib) vaccine. You may need this if you have certain risk factors. Talk to your health care provider about which screenings and vaccines you need  and how often you need them. This information is not intended to replace advice given to you by your health care provider. Make sure you discuss any questions you have with your health care provider. Document Released: 08/24/2001 Document Revised: 02/08/2017 Document Reviewed: 04/29/2015 Elsevier Interactive Patient Education  2019 [employer].

## 2018-09-21 ENCOUNTER — Other Ambulatory Visit: Payer: BLUE CROSS/BLUE SHIELD

## 2018-09-21 ENCOUNTER — Other Ambulatory Visit: Payer: Self-pay

## 2018-09-21 DIAGNOSIS — Z0000 Encounter for general adult medical examination without abnormal findings: Secondary | ICD-10-CM

## 2018-09-21 LAB — CYTOLOGY - PAP
Diagnosis: NEGATIVE
HPV (WINDOPATH): NOT DETECTED

## 2018-09-22 LAB — CBC WITH DIFFERENTIAL/PLATELET
Basophils Absolute: 0 10*3/uL (ref 0.0–0.2)
Basos: 1 %
EOS (ABSOLUTE): 0.1 10*3/uL (ref 0.0–0.4)
EOS: 2 %
HEMATOCRIT: 43.4 % (ref 34.0–46.6)
HEMOGLOBIN: 14.1 g/dL (ref 11.1–15.9)
IMMATURE GRANULOCYTES: 0 %
Immature Grans (Abs): 0 10*3/uL (ref 0.0–0.1)
Lymphocytes Absolute: 1.7 10*3/uL (ref 0.7–3.1)
Lymphs: 32 %
MCH: 28.8 pg (ref 26.6–33.0)
MCHC: 32.5 g/dL (ref 31.5–35.7)
MCV: 89 fL (ref 79–97)
MONOCYTES: 6 %
Monocytes Absolute: 0.3 10*3/uL (ref 0.1–0.9)
NEUTROS PCT: 59 %
Neutrophils Absolute: 3.1 10*3/uL (ref 1.4–7.0)
Platelets: 229 10*3/uL (ref 150–450)
RBC: 4.89 x10E6/uL (ref 3.77–5.28)
RDW: 12.9 % (ref 11.7–15.4)
WBC: 5.3 10*3/uL (ref 3.4–10.8)

## 2018-09-22 LAB — COMPREHENSIVE METABOLIC PANEL
ALK PHOS: 38 IU/L — AB (ref 39–117)
ALT: 11 IU/L (ref 0–32)
AST: 15 IU/L (ref 0–40)
Albumin/Globulin Ratio: 2.1 (ref 1.2–2.2)
Albumin: 4.6 g/dL (ref 3.8–4.8)
BUN/Creatinine Ratio: 20 (ref 9–23)
BUN: 14 mg/dL (ref 6–20)
Bilirubin Total: 0.3 mg/dL (ref 0.0–1.2)
CALCIUM: 9.1 mg/dL (ref 8.7–10.2)
CO2: 20 mmol/L (ref 20–29)
CREATININE: 0.7 mg/dL (ref 0.57–1.00)
Chloride: 103 mmol/L (ref 96–106)
GFR calc Af Amer: 131 mL/min/{1.73_m2} (ref >59)
GFR calc non Af Amer: 113 mL/min/{1.73_m2} (ref >59)
Globulin, Total: 2.2 g/dL (ref 1.5–4.5)
Glucose: 87 mg/dL (ref 65–99)
Potassium: 4.4 mmol/L (ref 3.5–5.2)
Sodium: 139 mmol/L (ref 134–144)
Total Protein: 6.8 g/dL (ref 6.0–8.5)

## 2018-09-22 LAB — LIPID PANEL
Chol/HDL Ratio: 3.6 ratio (ref 0.0–4.4)
Cholesterol, Total: 213 mg/dL — ABNORMAL HIGH (ref 100–199)
HDL: 60 mg/dL (ref >39)
LDL Calculated: 137 mg/dL — ABNORMAL HIGH (ref 0–99)
TRIGLYCERIDES: 79 mg/dL (ref 0–149)
VLDL CHOLESTEROL CAL: 16 mg/dL (ref 5–40)

## 2018-09-22 LAB — TSH: TSH: 1.64 u[IU]/mL (ref 0.450–4.500)

## 2018-09-26 ENCOUNTER — Other Ambulatory Visit: Payer: Self-pay | Admitting: Certified Nurse Midwife

## 2018-09-26 DIAGNOSIS — T8332XA Displacement of intrauterine contraceptive device, initial encounter: Secondary | ICD-10-CM

## 2018-09-26 DIAGNOSIS — Z975 Presence of (intrauterine) contraceptive device: Secondary | ICD-10-CM

## 2018-09-26 DIAGNOSIS — N921 Excessive and frequent menstruation with irregular cycle: Secondary | ICD-10-CM

## 2018-09-26 DIAGNOSIS — Z01419 Encounter for gynecological examination (general) (routine) without abnormal findings: Secondary | ICD-10-CM

## 2018-09-27 ENCOUNTER — Other Ambulatory Visit: Payer: Self-pay

## 2018-09-27 ENCOUNTER — Ambulatory Visit
Admission: RE | Admit: 2018-09-27 | Discharge: 2018-09-27 | Disposition: A | Discharge status: Home / Self Care | Payer: BLUE CROSS/BLUE SHIELD | Source: Ambulatory Visit | Attending: Certified Nurse Midwife | Admitting: Certified Nurse Midwife

## 2018-09-27 DIAGNOSIS — T8332XA Displacement of intrauterine contraceptive device, initial encounter: Secondary | ICD-10-CM | POA: Diagnosis present

## 2018-09-27 DIAGNOSIS — Z975 Presence of (intrauterine) contraceptive device: Secondary | ICD-10-CM | POA: Insufficient documentation

## 2018-09-27 DIAGNOSIS — N921 Excessive and frequent menstruation with irregular cycle: Secondary | ICD-10-CM | POA: Insufficient documentation

## 2018-09-27 DIAGNOSIS — Z01419 Encounter for gynecological examination (general) (routine) without abnormal findings: Secondary | ICD-10-CM | POA: Diagnosis present

## 2019-04-25 ENCOUNTER — Ambulatory Visit: Payer: BLUE CROSS/BLUE SHIELD

## 2019-04-25 ENCOUNTER — Other Ambulatory Visit: Payer: Self-pay

## 2019-04-25 ENCOUNTER — Ambulatory Visit: Payer: BC Managed Care – PPO

## 2019-04-25 DIAGNOSIS — Z23 Encounter for immunization: Secondary | ICD-10-CM

## 2019-09-21 ENCOUNTER — Encounter: Payer: Self-pay | Admitting: Certified Nurse Midwife

## 2019-09-21 ENCOUNTER — Other Ambulatory Visit: Payer: Self-pay

## 2019-09-21 ENCOUNTER — Ambulatory Visit (INDEPENDENT_AMBULATORY_CARE_PROVIDER_SITE_OTHER): Payer: BC Managed Care – PPO | Admitting: Certified Nurse Midwife

## 2019-09-21 VITALS — BP 114/77 | HR 90 | Ht 68.0 in | Wt 198.0 lb

## 2019-09-21 DIAGNOSIS — T8332XD Displacement of intrauterine contraceptive device, subsequent encounter: Secondary | ICD-10-CM | POA: Diagnosis not present

## 2019-09-21 DIAGNOSIS — Z01419 Encounter for gynecological examination (general) (routine) without abnormal findings: Secondary | ICD-10-CM

## 2019-09-21 DIAGNOSIS — R635 Abnormal weight gain: Secondary | ICD-10-CM

## 2019-09-21 DIAGNOSIS — Z3009 Encounter for other general counseling and advice on contraception: Secondary | ICD-10-CM

## 2019-09-21 NOTE — Progress Notes (Signed)
Patient here for annual exam. No complaints.  

## 2019-09-21 NOTE — Patient Instructions (Signed)
Preventive Care 21-36 Years Old, Female Preventive care refers to visits with your health care provider and lifestyle choices that can promote health and wellness. This includes:  A yearly physical exam. This may also be called an annual well check.  Regular dental visits and eye exams.  Immunizations.  Screening for certain conditions.  Healthy lifestyle choices, such as eating a healthy diet, getting regular exercise, not using drugs or products that contain nicotine and tobacco, and limiting alcohol use. What can I expect for my preventive care visit? Physical exam Your health care provider will check your:  Height and weight. This may be used to calculate body mass index (BMI), which tells if you are at a healthy weight.  Heart rate and blood pressure.  Skin for abnormal spots. Counseling Your health care provider may ask you questions about your:  Alcohol, tobacco, and drug use.  Emotional well-being.  Home and relationship well-being.  Sexual activity.  Eating habits.  Work and work environment.  Method of birth control.  Menstrual cycle.  Pregnancy history. What immunizations do I need?  Influenza (flu) vaccine  This is recommended every year. Tetanus, diphtheria, and pertussis (Tdap) vaccine  You may need a Td booster every 10 years. Varicella (chickenpox) vaccine  You may need this if you have not been vaccinated. Human papillomavirus (HPV) vaccine  If recommended by your health care provider, you may need three doses over 6 months. Measles, mumps, and rubella (MMR) vaccine  You may need at least one dose of MMR. You may also need a second dose. Meningococcal conjugate (MenACWY) vaccine  One dose is recommended if you are age 19-21 years and a first-year college student living in a residence hall, or if you have one of several medical conditions. You may also need additional booster doses. Pneumococcal conjugate (PCV13) vaccine  You may need  this if you have certain conditions and were not previously vaccinated. Pneumococcal polysaccharide (PPSV23) vaccine  You may need one or two doses if you smoke cigarettes or if you have certain conditions. Hepatitis A vaccine  You may need this if you have certain conditions or if you travel or work in places where you may be exposed to hepatitis A. Hepatitis B vaccine  You may need this if you have certain conditions or if you travel or work in places where you may be exposed to hepatitis B. Haemophilus influenzae type b (Hib) vaccine  You may need this if you have certain conditions. You may receive vaccines as individual doses or as more than one vaccine together in one shot (combination vaccines). Talk with your health care provider about the risks and benefits of combination vaccines. What tests do I need?  Blood tests  Lipid and cholesterol levels. These may be checked every 5 years starting at age 20.  Hepatitis C test.  Hepatitis B test. Screening  Diabetes screening. This is done by checking your blood sugar (glucose) after you have not eaten for a while (fasting).  Sexually transmitted disease (STD) testing.  BRCA-related cancer screening. This may be done if you have a family history of breast, ovarian, tubal, or peritoneal cancers.  Pelvic exam and Pap test. This may be done every 3 years starting at age 21. Starting at age 30, this may be done every 5 years if you have a Pap test in combination with an HPV test. Talk with your health care provider about your test results, treatment options, and if necessary, the need for more tests.   Follow these instructions at home: Eating and drinking   Eat a diet that includes fresh fruits and vegetables, whole grains, lean protein, and low-fat dairy.  Take vitamin and mineral supplements as recommended by your health care provider.  Do not drink alcohol if: ? Your health care provider tells you not to drink. ? You are  pregnant, may be pregnant, or are planning to become pregnant.  If you drink alcohol: ? Limit how much you have to 0-1 drink a day. ? Be aware of how much alcohol is in your drink. In the [place] one drink equals one 12 oz bottle of beer (355 mL), one 5 oz glass of wine (148 mL), or one 1 oz glass of hard liquor (44 mL). Lifestyle  Take daily care of your teeth and gums.  Stay active. Exercise for at least 30 minutes on 5 or more days each week.  Do not use any products that contain nicotine or tobacco, such as cigarettes, e-cigarettes, and chewing tobacco. If you need help quitting, ask your health care provider.  If you are sexually active, practice safe sex. Use a condom or other form of birth control (contraception) in order to prevent pregnancy and STIs (sexually transmitted infections). If you plan to become pregnant, see your health care provider for a preconception visit. What's next?  Visit your health care provider once a year for a well check visit.  Ask your health care provider how often you should have your eyes and teeth checked.  Stay up to date on all vaccines. This information is not intended to replace advice given to you by your health care provider. Make sure you discuss any questions you have with your health care provider. Document Revised: 03/09/2018 Document Reviewed: 03/09/2018 Elsevier Patient Education  2020 [employer].

## 2019-09-22 NOTE — Progress Notes (Signed)
ANNUAL PREVENTATIVE CARE GYN  ENCOUNTER NOTE  Subjective:       Kaitlin Bond is a 34 y.o. G56P1001 female here for a routine annual gynecologic exam.  Current complaints: 1. Questions regarding non-hormonal contraception options  Denies difficulty breathing or respiratory distress, chest pain, abdominal pain, excessive vaginal bleeding, dysuria, and leg pain or swelling.    Gynecologic History  No LMP recorded (lmp unknown). (Menstrual status: IUD).  Contraception: IUD, Mirena  Last Pap: 09/2018. Results were: Neg/Neg  Obstetric History  OB History  Gravida Para Term Preterm AB Living  1 1 1     1   SAB TAB Ectopic Multiple Live Births          1    # Outcome Date GA Lbr Len/2nd Weight Sex Delivery Anes PTL Lv  1 Term 08/07/09   8 lb 14 oz (4.026 kg) F CS-LTranv Spinal N LIV    Past Surgical History:  Procedure Laterality Date  . BREAST REDUCTION SURGERY    . CESAREAN SECTION      Current Outpatient Medications on File Prior to Visit  Medication Sig Dispense Refill  . Biotin w/ Vitamins C & E (HAIR/SKIN/NAILS PO) Take 2 tablets by mouth daily.    . Homeopathic Products (ZICAM COLD REMEDY PO) Take 1 Units by mouth as needed.     Marland Kitchen levonorgestrel (MIRENA) 20 MCG/24HR IUD 1 each by Intrauterine route once.     No current facility-administered medications on file prior to visit.    Allergies  Allergen Reactions  . Azithromycin     Makes patient "feel funny", strange feeling.  . Other     Gluten intolerance    Social History   Socioeconomic History  . Marital status: Married    Spouse name: Not on file  . Number of children: Not on file  . Years of education: Not on file  . Highest education level: Not on file  Occupational History  . Not on file  Tobacco Use  . Smoking status: Never Smoker  . Smokeless tobacco: Never Used  Substance and Sexual Activity  . Alcohol use: No  . Drug use: No  . Sexual activity: Yes    Birth control/protection: I.U.D.   Other Topics Concern  . Not on file  Social History Narrative  . Not on file   Social Determinants of Health   Financial Resource Strain:   . Difficulty of Paying Living Expenses:   Food Insecurity:   . Worried About Charity fundraiser in the Last Year:   . Arboriculturist in the Last Year:   Transportation Needs:   . Film/video editor (Medical):   Marland Kitchen Lack of Transportation (Non-Medical):   Physical Activity:   . Days of Exercise per Week:   . Minutes of Exercise per Session:   Stress:   . Feeling of Stress :   Social Connections:   . Frequency of Communication with Friends and Family:   . Frequency of Social Gatherings with Friends and Family:   . Attends Religious Services:   . Active Member of Clubs or Organizations:   . Attends Archivist Meetings:   Marland Kitchen Marital Status:   Intimate Partner Violence:   . Fear of Current or Ex-Partner:   . Emotionally Abused:   Marland Kitchen Physically Abused:   . Sexually Abused:     Family History  Problem Relation Age of Onset  . Breast cancer Mother   . Breast cancer Maternal Grandmother   .  Cancer Paternal Grandfather   . Ovarian cancer Neg Hx   . Colon cancer Neg Hx     The following portions of the patient's history were reviewed and updated as appropriate: allergies, current medications, past family history, past medical history, past social history, past surgical history and problem list.  Review of Systems  ROS negative except as noted above. Information obtained from patient.    Objective:   BP 114/77   Pulse 90   Ht 5\' 8"  (1.727 m)   Wt 198 lb (89.8 kg)   LMP  (LMP Unknown)   BMI 30.11 kg/m   CONSTITUTIONAL: Well-developed, well-nourished female in no acute distress.   PSYCHIATRIC: Normal mood and affect. Normal behavior. Normal judgment and thought content.  NEUROLGIC: Alert and oriented to person, place, and time. Normal muscle tone coordination. No cranial nerve deficit noted.  HENT:  Normocephalic,  atraumatic, External right and left ear normal.   EYES: Conjunctivae and EOM are normal. Pupils are equal and round.   NECK: Normal range of motion, supple, no masses.  Normal thyroid.   SKIN: Skin is warm and dry. No rash noted. Not diaphoretic. No erythema. No pallor.  CARDIOVASCULAR: Normal heart rate noted, regular rhythm, no murmur.  RESPIRATORY: Clear to auscultation bilaterally. Effort and breath sounds normal, no problems with respiration noted.  BREASTS: Symmetric in size. No masses, skin changes, nipple drainage, or lymphadenopathy.  ABDOMEN: Soft, normal bowel sounds, no distention noted.  No tenderness, rebound or guarding.   PELVIC:  External Genitalia: Normal  Vagina: Normal  Cervix: Normal  Uterus: Normal  Adnexa: Normal    MUSCULOSKELETAL: Normal range of motion. No tenderness.  No cyanosis, clubbing, or edema.  2+ distal pulses.  LYMPHATIC: No Axillary, Supraclavicular, or Inguinal Adenopathy.  Assessment:   Annual gynecologic examination 34 y.o.   Contraception: IUD, Mirena   Obesity 1   Problem List Items Addressed This Visit    None    Visit Diagnoses    Well woman exam    -  Primary   Intrauterine contraceptive device threads lost, initial encounter       Weight gain          Plan:   Pap: Not needed  Labs: Declined  Routine preventative health maintenance measures emphasized: Exercise/Diet/Weight control, Tobacco Warnings, Alcohol/Substance use risks and Stress Management; see AVS  Education regarding non-hormonal contraception options; see AVS  Return to Clinic - 1 Year   29, CNM [facility], [facility] 09/22/19 1:26 PM

## 2020-09-26 ENCOUNTER — Encounter: Payer: BC Managed Care – PPO | Admitting: Certified Nurse Midwife

## 2020-09-26 ENCOUNTER — Other Ambulatory Visit: Payer: Self-pay

## 2020-09-26 ENCOUNTER — Encounter: Payer: Self-pay | Admitting: Certified Nurse Midwife

## 2020-09-26 ENCOUNTER — Ambulatory Visit: Payer: BC Managed Care – PPO | Admitting: Certified Nurse Midwife

## 2020-09-26 VITALS — BP 140/84 | HR 82 | Ht 68.0 in | Wt 176.1 lb

## 2020-09-26 DIAGNOSIS — Z30433 Encounter for removal and reinsertion of intrauterine contraceptive device: Secondary | ICD-10-CM

## 2020-09-26 NOTE — Patient Instructions (Signed)
IUD PLACEMENT POST-PROCEDURE INSTRUCTIONS  1. You may take Ibuprofen, Aleve or Tylenol for pain if needed.  Cramping should resolve within in 24 hours.  2. You may have a small amount of spotting.  You should wear a mini pad for the next few days.  3. You may have intercourse after 72 hours.  If you using this for birth control, it is effective immediately.  4. You need to call if you have any pelvic pain, fever, heavy bleeding or foul smelling vaginal discharge.  Irregular bleeding is common the first several months after having an IUD placed. You do not need to call for this reason unless you are concerned.  5. Shower or bathe as normal  6. You should have a follow-up appointment in 4-8 weeks for a re-check to make sure you are not having any problems. 

## 2020-09-26 NOTE — Progress Notes (Signed)
Kaitlin Bond is a 35 y.o. year old G48P1001 Caucasian female who presents for removal of Mirena IUD and placement of a Paragard IUD. She was given informed consent for removal and reinsertion of IUD.   The risks and benefits of the method and placement have been thouroughly reviewed with the patient and all questions were answered.    Specifically the patient is aware of failure rate of 07/998, expulsion of the IUD and of possible perforation.    The patient is aware of heavy, painful menses associated with the method.   Signed copy of informed consent in chart.   BP 140/84   Pulse 82   Ht 5\' 8"  (1.727 m)   Wt 176 lb 1.6 oz (79.9 kg)   LMP  (LMP Unknown)   BMI 26.78 kg/m    Appropriate time out taken. A small plastic speculum was placed in the vagina.  The cervix was visualized, prepped using Betadine. The strings were NOT visible.   The cervix was then grasped with a single-tooth tenaculum. An IUD hook was used and after two (2) attempts the strings were visible. They were grasped and the Mirena was easily removed.    The uterus was sounded to 7 cm.  Paragard IUD placed per manufacturer's recommendations without complications. The strings were trimmed to 3 cm.  The patient tolerated the procedure well.   The patient was given post procedure instructions, including signs and symptoms of infection and to check for the strings after each menses or each month, and refraining from intercourse or anything in the vagina for 3 days.  She was given a Paragard care card with date Paragard placed, and date Paragard to be removed.  Reviewed red flag symptoms and when to call.   RTC x 6-8 weeks for IUD string check or sooner if needed.    , CNM [facility], [facility] 09/26/20 8:57 AM   NDC: 09/28/20 Lot: [id number] Exp: 10/2025

## 2020-11-03 ENCOUNTER — Other Ambulatory Visit: Payer: Self-pay

## 2020-11-03 ENCOUNTER — Encounter: Payer: Self-pay | Admitting: Certified Nurse Midwife

## 2020-11-03 ENCOUNTER — Ambulatory Visit (INDEPENDENT_AMBULATORY_CARE_PROVIDER_SITE_OTHER): Payer: BC Managed Care – PPO | Admitting: Certified Nurse Midwife

## 2020-11-03 VITALS — BP 122/84 | HR 76 | Resp 16 | Ht 68.0 in | Wt 175.3 lb

## 2020-11-03 DIAGNOSIS — Z975 Presence of (intrauterine) contraceptive device: Secondary | ICD-10-CM | POA: Diagnosis not present

## 2020-11-03 DIAGNOSIS — Z1159 Encounter for screening for other viral diseases: Secondary | ICD-10-CM

## 2020-11-03 DIAGNOSIS — Z01419 Encounter for gynecological examination (general) (routine) without abnormal findings: Secondary | ICD-10-CM | POA: Diagnosis not present

## 2020-11-03 DIAGNOSIS — Z23 Encounter for immunization: Secondary | ICD-10-CM

## 2020-11-03 DIAGNOSIS — Z30431 Encounter for routine checking of intrauterine contraceptive device: Secondary | ICD-10-CM

## 2020-11-03 DIAGNOSIS — Z114 Encounter for screening for human immunodeficiency virus [HIV]: Secondary | ICD-10-CM

## 2020-11-03 NOTE — Patient Instructions (Signed)
Intrauterine Device Information An intrauterine device (IUD) is a medical device that is inserted into the uterus to prevent pregnancy. It is a small, T-shaped device that has one or two nylon strings hanging down from it. The strings hang out of the lower part of the uterus (cervix) to allow for future IUD removal. There are two types of IUDs:  Hormone IUD. This type of IUD is made of plastic and contains the hormone progestin (synthetic progesterone). A hormone IUD may last 3-5 years.  Copper IUD. This type of IUD has copper wire wrapped around it. A copper IUD may last up to 10 years. How is an IUD inserted? An IUD is inserted through the vagina, through the cervix, and into the uterus with a minor medical procedure. The procedure for IUD insertion may vary among health care providers and hospitals. How does an IUD work? Synthetic progesterone in a hormonal IUD prevents pregnancy by:  Thickening cervical mucus to prevent sperm from entering the uterus.  Thinning the uterine lining to prevent a fertilized egg from being implanted there. Copper in a copper IUD prevents pregnancy by making the uterus and fallopian tubes produce a fluid that kills sperm. What are the advantages of an IUD? Advantages of either type of IUD An IUD:  Is highly effective in preventing pregnancy.  Is reversible. You can become pregnant shortly after the IUD is removed.  Is low-maintenance and can stay in place for a long time.  Has no estrogen-related side effects.  Can be used when breastfeeding.  Is not associated with weight gain.  Can be inserted right after childbirth, an abortion, or a miscarriage. Advantages of a hormone IUD  If it is inserted within 7 days of your period starting, it works right after it has been inserted. If the hormone IUD is inserted at any other time in your cycle, you will need to use a backup method of birth control for 7 days after insertion.  It can make menstrual periods  lighter or stop completely.  It can reduce menstrual cramping and other discomforts from menstrual periods.  It can be used for 3-5 years, depending on which IUD you have. Advantages of a copper IUD  It works right after it is inserted.  It can be used as a form of emergency birth control if it is inserted within 5 days after having unprotected sex.  It does not interfere with your body's natural hormones.  It can be used for up to 10 years. What are the disadvantages of an IUD?  An IUD may cause irregular menstrual bleeding for a period of time after insertion.  It is common to have pain during insertion and have cramping and vaginal bleeding after insertion.  An IUD may cut the uterus (uterine perforation) when it is inserted. This is rare.  Pelvic inflammatory disease (PID) may happen after insertion of an IUD. PID is an infection in the uterus and fallopian tubes. The IUD does not cause the infection. The infection is usually from an unknown sexually transmitted infection (STI). This is rare, and it usually happens during the first 20 days after the IUD is inserted.  A copper IUD can make your menstrual flow heavier and more painful.  IUDs cannot prevent sexually transmitted infections (STIs). How is an IUD removed?   You will lie on your back with your knees bent and your feet in footrests (stirrups).  A device will be inserted into your vagina to spread apart the vaginal walls (  speculum). This will allow your health care provider to see the strings attached to the IUD.  Your health care provider will use a small instrument (forceps) to grasp the IUD strings and will pull firmly until the IUD is removed. You may have some discomfort when the IUD is removed. Your health care provider may recommend taking over-the-counter pain relievers, such as ibuprofen, before the procedure. You may also have minor spotting for a few days after the procedure. The procedure for IUD removal may  vary among health care providers and hospitals. Is an IUD right for me? If you are interested in an IUD, discuss it with your health care provider. He or she will make sure you are a good candidate for an IUD and will let you know more about the advantages, disadvantage, and possible side effects. This will allow you to make a decision about the device. Summary  An intrauterine device (IUD) is a medical device that is inserted in the uterus to prevent pregnancy. It is a small, T-shaped device that has one or two nylon strings hanging down from it.  A hormone IUD contains the hormone progestin (synthetic progesterone). A copper IUD has copper wire wrapped around it.  Synthetic progesterone in a hormone IUD prevents pregnancy by thickening cervical mucus and thinning the walls of the uterus. Copper in a copper IUD prevents pregnancy by making the uterus and fallopian tubes produce a fluid that kills sperm.  A hormone IUD can be left in place for 3-5 years. A copper IUD can be left in place for up to 10 years.  An IUD is inserted and removed by a health care provider. You may feel some pain during insertion and removal. Your health care provider may recommend taking over-the-counter pain medicine, such as ibuprofen, before an IUD procedure. This information is not intended to replace advice given to you by your health care provider. Make sure you discuss any questions you have with your health care provider. Document Revised: 01/09/2020 Document Reviewed: 01/09/2020 Elsevier Patient Education  [place] 36-41 Years Old, Female Preventive care refers to lifestyle choices and visits with your health care provider that can promote health and wellness. This includes:  A yearly physical exam. This is also called an annual wellness visit.  Regular dental and eye exams.  Immunizations.  Screening for certain conditions.  Healthy lifestyle choices, such as: ? Eating a  healthy diet. ? Getting regular exercise. ? Not using drugs or products that contain nicotine and tobacco. ? Limiting alcohol use. What can I expect for my preventive care visit? Physical exam Your health care provider may check your:  Height and weight. These may be used to calculate your BMI (body mass index). BMI is a measurement that tells if you are at a healthy weight.  Heart rate and blood pressure.  Body temperature.  Skin for abnormal spots. Counseling Your health care provider may ask you questions about your:  Past medical problems.  Family's medical history.  Alcohol, tobacco, and drug use.  Emotional well-being.  Home life and relationship well-being.  Sexual activity.  Diet, exercise, and sleep habits.  Work and work Statistician.  Access to firearms.  Method of birth control.  Menstrual cycle.  Pregnancy history. What immunizations do I need? Vaccines are usually given at various ages, according to a schedule. Your health care provider will recommend vaccines for you based on your age, medical history, and lifestyle or other factors, such as  travel or where you work.   What tests do I need? Blood tests  Lipid and cholesterol levels. These may be checked every 5 years starting at age 67.  Hepatitis C test.  Hepatitis B test. Screening  Diabetes screening. This is done by checking your blood sugar (glucose) after you have not eaten for a while (fasting).  STD (sexually transmitted disease) testing, if you are at risk.  BRCA-related cancer screening. This may be done if you have a family history of breast, ovarian, tubal, or peritoneal cancers.  Pelvic exam and Pap test. This may be done every 3 years starting at age 32. Starting at age 23, this may be done every 5 years if you have a Pap test in combination with an HPV test. Talk with your health care provider about your test results, treatment options, and if necessary, the need for more tests.    Follow these instructions at home: Eating and drinking  Eat a healthy diet that includes fresh fruits and vegetables, whole grains, lean protein, and low-fat dairy products.  Take vitamin and mineral supplements as recommended by your health care provider.  Do not drink alcohol if: ? Your health care provider tells you not to drink. ? You are pregnant, may be pregnant, or are planning to become pregnant.  If you drink alcohol: ? Limit how much you have to 0-1 drink a day. ? Be aware of how much alcohol is in your drink. In the [place] one drink equals one 12 oz bottle of beer (355 mL), one 5 oz glass of wine (148 mL), or one 1 oz glass of hard liquor (44 mL).   Lifestyle  Take daily care of your teeth and gums. Brush your teeth every morning and night with fluoride toothpaste. Floss one time each day.  Stay active. Exercise for at least 30 minutes 5 or more days each week.  Do not use any products that contain nicotine or tobacco, such as cigarettes, e-cigarettes, and chewing tobacco. If you need help quitting, ask your health care provider.  Do not use drugs.  If you are sexually active, practice safe sex. Use a condom or other form of protection to prevent STIs (sexually transmitted infections).  If you do not wish to become pregnant, use a form of birth control. If you plan to become pregnant, see your health care provider for a prepregnancy visit.  Find healthy ways to cope with stress, such as: ? Meditation, yoga, or listening to music. ? Journaling. ? Talking to a trusted person. ? Spending time with friends and family. Safety  Always wear your seat belt while driving or riding in a vehicle.  Do not drive: ? If you have been drinking alcohol. Do not ride with someone who has been drinking. ? When you are tired or distracted. ? While texting.  Wear a helmet and other protective equipment during sports activities.  If you have firearms in your house, make sure you  follow all gun safety procedures.  Seek help if you have been physically or sexually abused. What's next?  Go to your health care provider once a year for an annual wellness visit.  Ask your health care provider how often you should have your eyes and teeth checked.  Stay up to date on all vaccines. This information is not intended to replace advice given to you by your health care provider. Make sure you discuss any questions you have with your health care provider. Document Revised: 02/24/2020 Document  Reviewed: 03/09/2018 Elsevier Patient Education  [place].

## 2020-11-04 NOTE — Progress Notes (Signed)
ANNUAL PREVENTATIVE CARE GYN  ENCOUNTER NOTE  Subjective:       Kaitlin Bond is a 35 y.o. G54P1001 female here for a routine annual gynecologic exam.  Current complaints: 1. Needs IUD string check; IUD replaced on 09/26/2020, for further details, see previous note  Denies difficulty breathing or respiratory distress, chest pain, abdominal pain, excessive vaginal bleeding, dysuria, and leg pain or swelling.    Gynecologic History  Patient's last menstrual period was 10/27/2020 (approximate). Period Duration (Days): Four (4) Period Pattern: Regular Menstrual Flow: Heavy,Moderate Menstrual Control: Maxi pad,Tampon Menstrual Control Change Freq (Hours): Three (3) to six (6) Dysmenorrhea: None  Contraception: IUD, Paragard  Last Pap: 09/2018. Results were: Negative/Negative  Obstetric History  OB History  Gravida Para Term Preterm AB Living  1 1 1     1   SAB IAB Ectopic Multiple Live Births          1    # Outcome Date GA Lbr Len/2nd Weight Sex Delivery Anes PTL Lv  1 Term 08/07/09   8 lb 14 oz (4.026 kg) F CS-LTranv Spinal N LIV    History reviewed. No pertinent past medical history.  Past Surgical History:  Procedure Laterality Date  . BREAST REDUCTION SURGERY    . CESAREAN SECTION     Allergies  Allergen Reactions  . Azithromycin     Makes patient "feel funny", strange feeling.  . Other     Gluten intolerance    Social History   Socioeconomic History  . Marital status: Married    Spouse name: Not on file  . Number of children: Not on file  . Years of education: Not on file  . Highest education level: Not on file  Occupational History  . Not on file  Tobacco Use  . Smoking status: Never Smoker  . Smokeless tobacco: Never Used  Vaping Use  . Vaping Use: Never used  Substance and Sexual Activity  . Alcohol use: No  . Drug use: No  . Sexual activity: Yes    Birth control/protection: I.U.D.  Other Topics Concern  . Not on file  Social History  Narrative  . Not on file   Social Determinants of Health   Financial Resource Strain: Not on file  Food Insecurity: Not on file  Transportation Needs: Not on file  Physical Activity: Not on file  Stress: Not on file  Social Connections: Not on file  Intimate Partner Violence: Not on file    Family History  Problem Relation Age of Onset  . Breast cancer Mother   . Breast cancer Maternal Grandmother   . Cancer Paternal Grandfather   . Ovarian cancer Neg Hx   . Colon cancer Neg Hx     The following portions of the patient's history were reviewed and updated as appropriate: allergies, current medications, past family history, past medical history, past social history, past surgical history and problem list.  Review of Systems  ROS negative except as noted above. Information obtained from patient.    Objective:   BP 122/84   Pulse 76   Resp 16   Ht 5\' 8"  (1.727 m)   Wt 175 lb 4.8 oz (79.5 kg)   LMP 10/27/2020 (Approximate)   BMI 26.65 kg/m    CONSTITUTIONAL: Well-developed, well-nourished female in no acute distress.   PSYCHIATRIC: Normal mood and affect. Normal behavior. Normal judgment and  thought content.  NEUROLGIC: Alert and oriented to person, place, and time. Normal muscle tone coordination. No cranial  nerve deficit noted.  HENT:  Normocephalic, atraumatic, External right and left ear normal.  EYES: Conjunctivae and EOM are normal.  NECK: Normal range of motion, supple, no masses.  Normal thyroid.   SKIN: Skin is warm and dry. No rash noted. Not diaphoretic. No erythema. No  pallor.  CARDIOVASCULAR: Normal heart rate noted, regular rhythm, no murmur.  RESPIRATORY: Clear to auscultation bilaterally. Effort and breath sounds normal, no problems with respiration noted.  BREASTS: Symmetric in size. No masses, skin changes, nipple drainage, or lymphadenopathy. Breast reduction scars present.   ABDOMEN: Soft, normal bowel sounds, no distention noted.  No  tenderness, rebound or guarding.   PELVIC:  External Genitalia: Normal  Vagina: Normal  Cervix: Normal, IUD strings present  Uterus: Normal  Adnexa: Normal  MUSCULOSKELETAL: Normal range of motion. No tenderness.  No cyanosis, clubbing, or edema.  2+ distal pulses.  LYMPHATIC: No Axillary, Supraclavicular, or Inguinal Adenopathy.  Assessment:   Annual gynecologic examination 35 y.o.   Contraception: IUD, Paragard   Overweight   Problem List Items Addressed This Visit   None   Visit Diagnoses    Well woman exam    -  Primary   IUD (intrauterine device) in place       IUD check up          Plan:   Pap: Not needed  Labs: Managed by PCP  Routine preventative health maintenance measures emphasized: Exercise/Diet/Weight control, Tobacco Warnings, Alcohol/Substance use risks and Stress Management; see AVS  Reviewed red flag symptoms and when to call  Return to Clinic - 1 Year for [facility] or sooner if needed   Serafina Royals, CNM  [facility], [facility]

## 2020-11-07 ENCOUNTER — Encounter: Payer: BC Managed Care – PPO | Admitting: Certified Nurse Midwife

## 2021-01-07 IMAGING — US TRANSVAGINAL ULTRASOUND OF PELVIS
1 series · 14 of 25 positions shown · non-contrast
Comparison: None

CLINICAL DATA: Breakthrough bleeding, IUD with lost threads

EXAM:
TRANSABDOMINAL AND TRANSVAGINAL ULTRASOUND OF PELVIS
TECHNIQUE: Both transabdominal and transvaginal ultrasound examinations of the
pelvis were performed. Transabdominal technique was performed for
global imaging of the pelvis including uterus, ovaries, adnexal
regions, and pelvic cul-de-sac. It was necessary to proceed with
endovaginal exam following the transabdominal exam to visualize the
uterus endometrium ovaries.

[Series 1: transvaginal ultrasound of pelvis · 0.22mm/px · 14 of 83 slices shown]
[im 1/83]
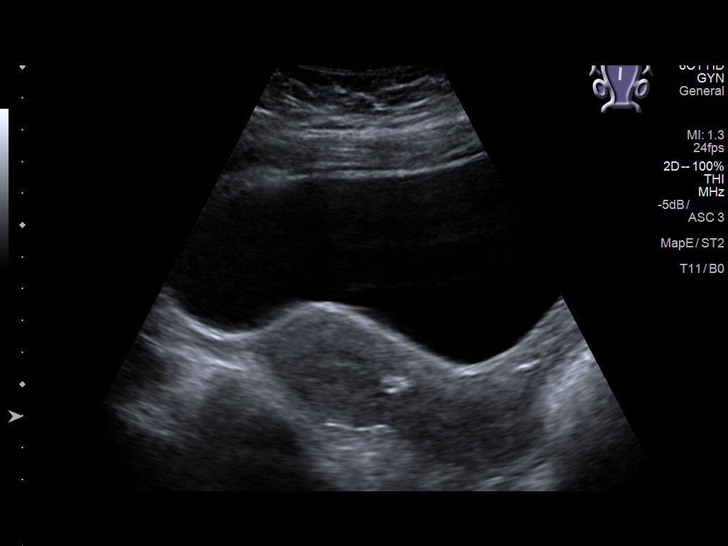
[im 7/83]
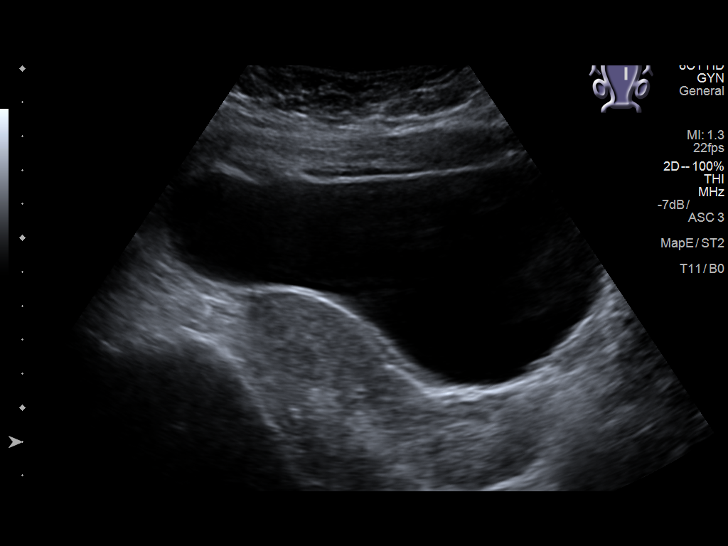
[im 14/83]
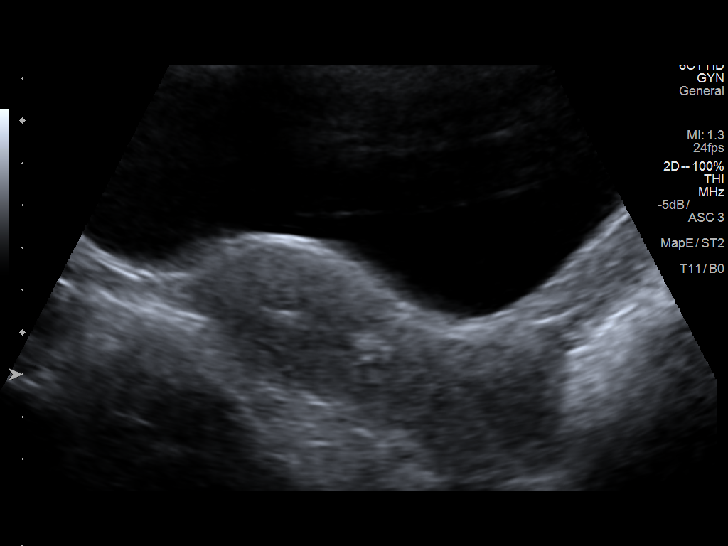
[im 21/83]
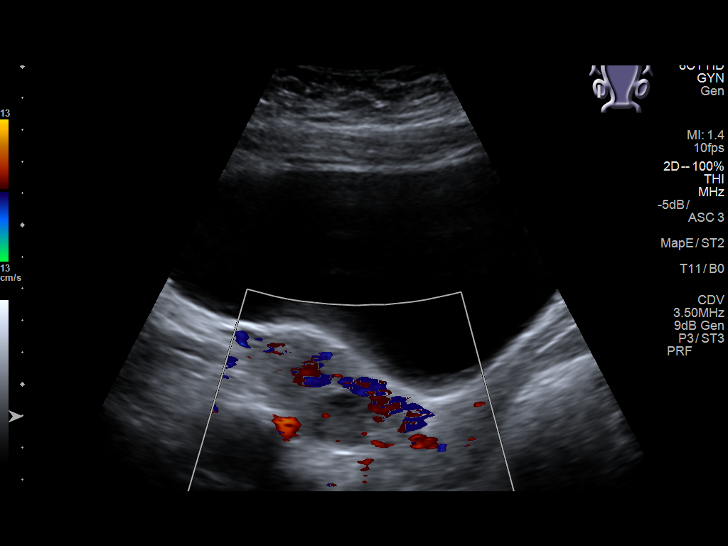
[im 28/83]
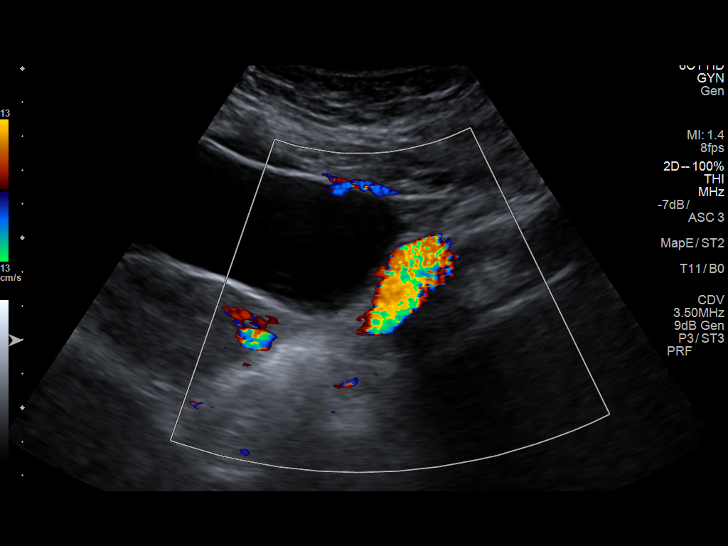
[im 31/83]
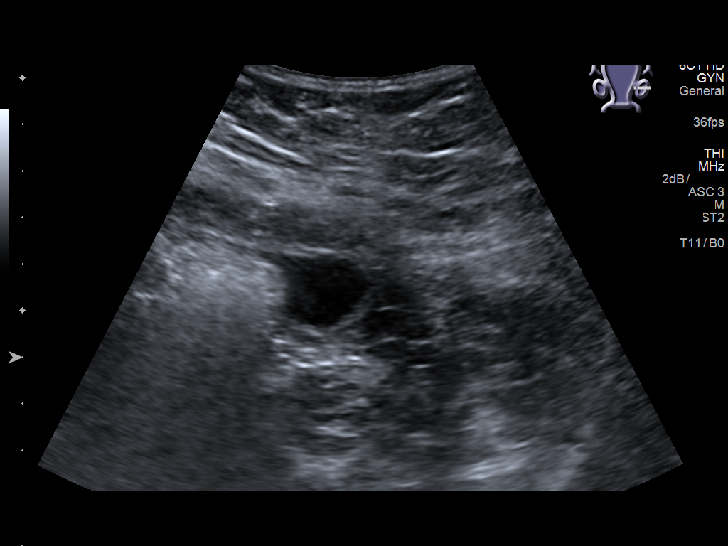
[im 38/83]
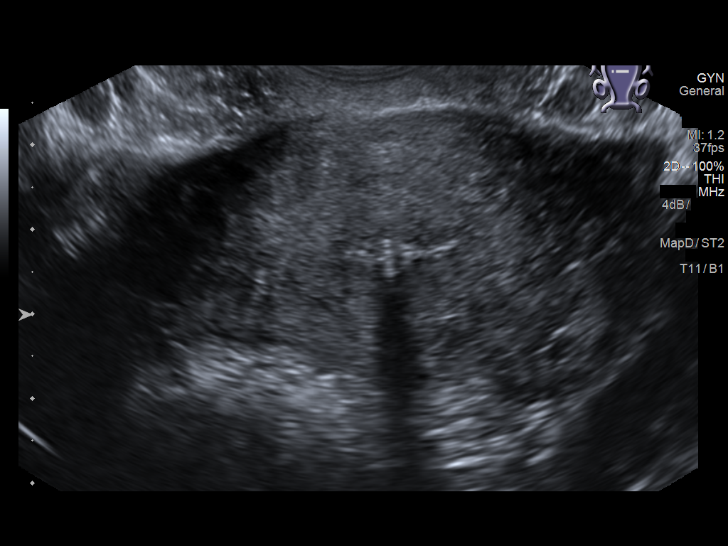
[im 45/83]
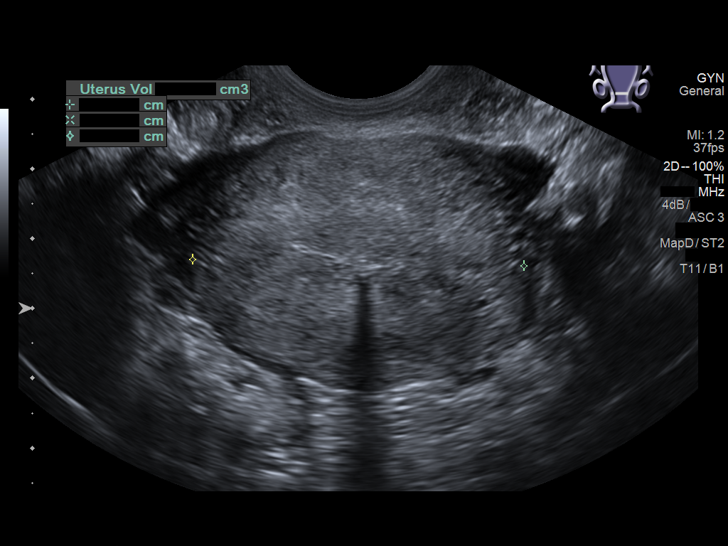
[im 52/83]
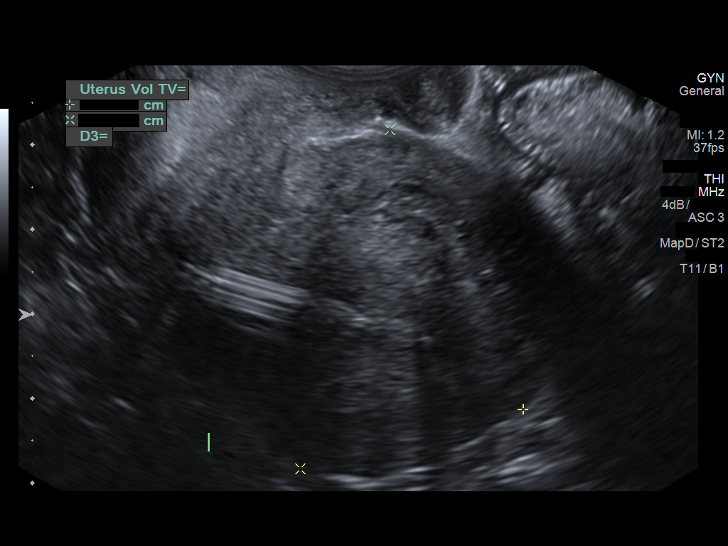
[im 55/83]
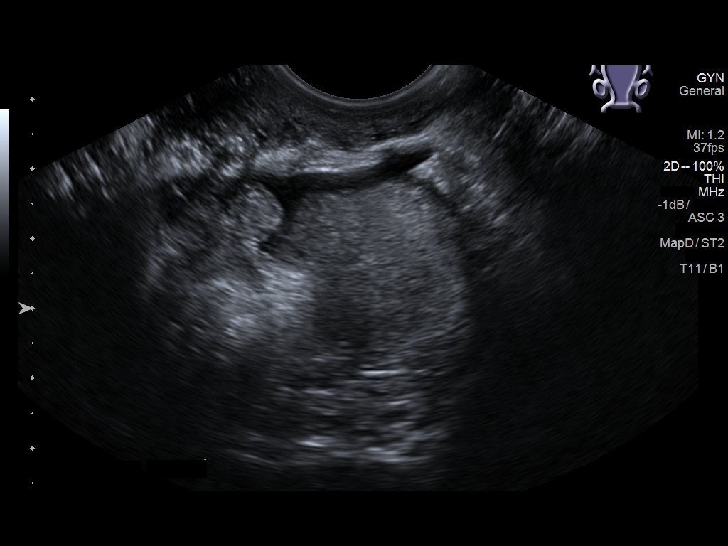
[im 62/83]
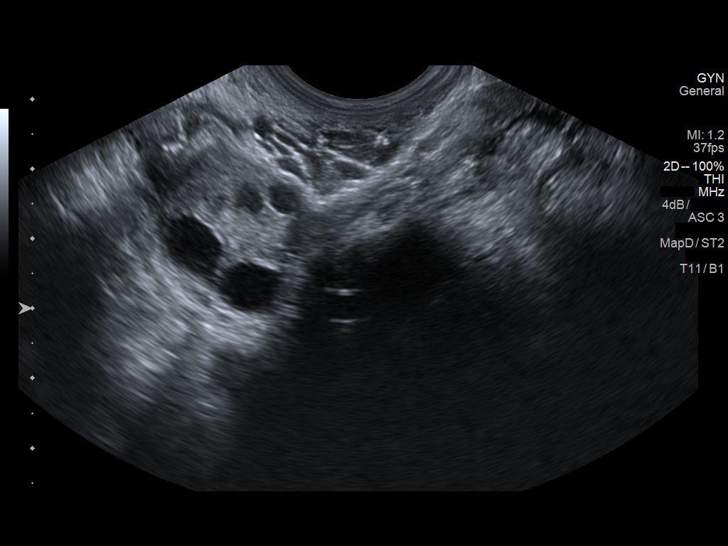
[im 69/83]
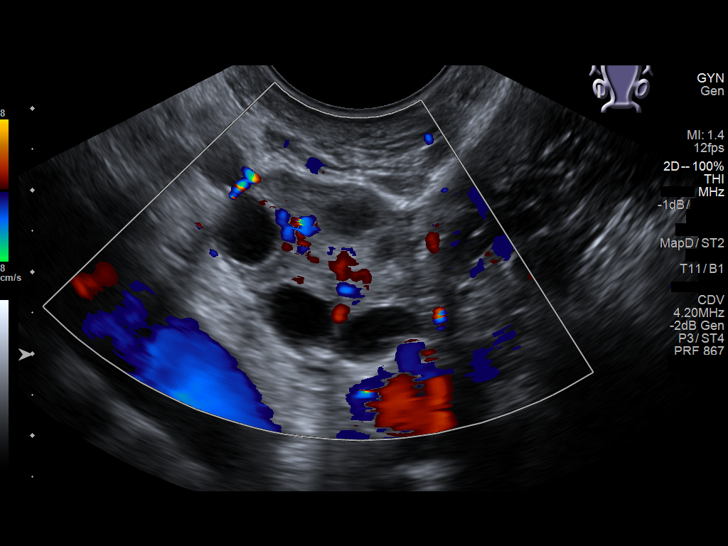
[im 76/83]
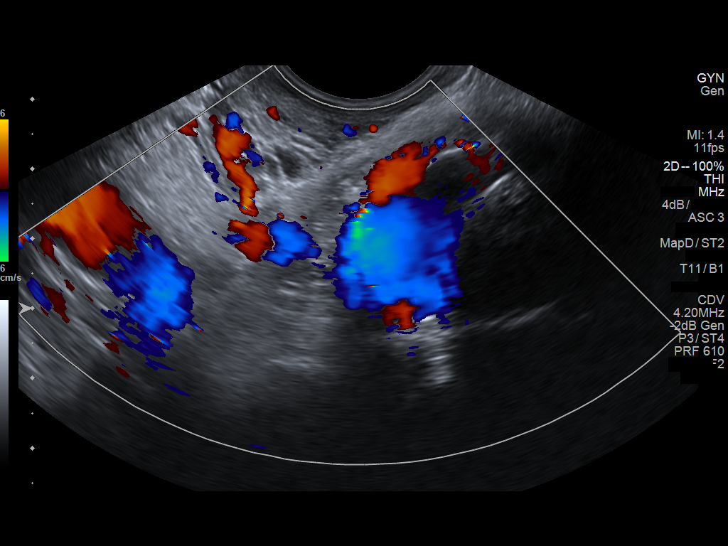
[im 83/83]
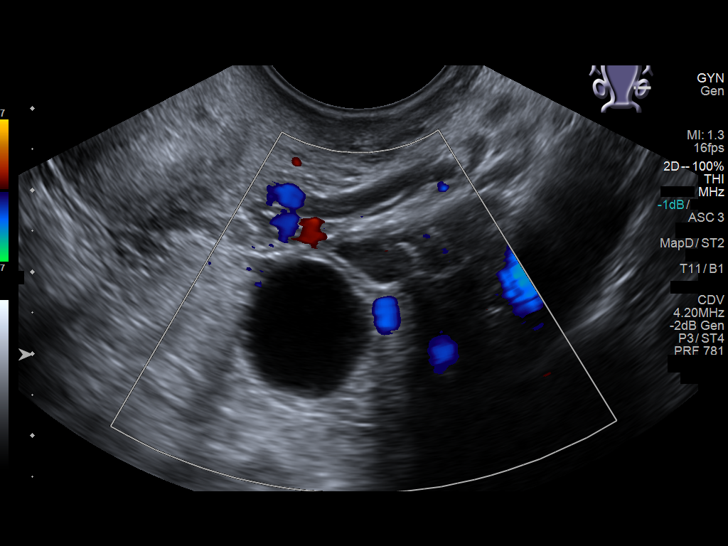

[14 of 25 positions shown; findings below may reference images not displayed]

FINDINGS: Uterus

Measurements: 8.9 x 3.4 x 4.7 cm = volume: 73.7 mL. No fibroids or
other mass visualized.

Endometrium

Thickness: 11 mm.  Intrauterine device in place.

Right ovary

Measurements: 3 x 2.4 x 2 cm = volume: 7.4 mL. Normal appearance/no
adnexal mass.

Left ovary

Measurements: 3 x 2.1 x 1.9 cm = volume: 6.5 mL. Normal
appearance/no adnexal mass.

Other findings

Trace free fluid
IMPRESSION: 1. Intrauterine device appears appropriately positioned by
sonography.
2. Trace free fluid.

## 2022-12-08 ENCOUNTER — Ambulatory Visit: Payer: Managed Care, Other (non HMO)

## 2022-12-08 ENCOUNTER — Encounter: Payer: Self-pay | Admitting: Obstetrics

## 2022-12-08 VITALS — BP 111/73 | HR 66 | Resp 16 | Ht 68.0 in | Wt 183.3 lb

## 2022-12-08 DIAGNOSIS — Z01419 Encounter for gynecological examination (general) (routine) without abnormal findings: Secondary | ICD-10-CM | POA: Diagnosis not present

## 2022-12-08 DIAGNOSIS — Z3009 Encounter for other general counseling and advice on contraception: Secondary | ICD-10-CM

## 2022-12-08 DIAGNOSIS — Z30433 Encounter for removal and reinsertion of intrauterine contraceptive device: Secondary | ICD-10-CM

## 2022-12-08 MED ORDER — LEVONORGESTREL 20 MCG/DAY IU IUD
1.0000 | INTRAUTERINE_SYSTEM | Freq: Once | INTRAUTERINE | Status: AC
Start: 2022-12-08 — End: ?

## 2022-12-08 NOTE — Progress Notes (Signed)
Provider: Autumn Messing, CNM Division: Midwifery  Outpatient Gynecology Note: Annual Visit  Assessment/Plan:    Kaitlin Bond is a 37 y.o. female G64P1001 with normal well-woman gynecologic exam.  Well Woman Exam - Feeling well overall - Reviewed health maintenance topics as documented below - Discussed recommendation to start annual breast cancer screening with mammogram starting next year given family history.  - Pap smear UTD, due for repeat screening in 2025  Contraception Counseling - Patient had a Paragard IUD placed in 2022 after having previously used a Mirena IUD. She was wanting to have regular periods at the time but now would like to go back to a form of contraception that would stop menstrual bleeding. She does not desire more children at this time. - We reviewed options for contraception that would meet her desired goals including continuous cycling combined estrogen/progesterone options and Mirena IUD. After this discussion, she prefers to return to Mirena IUD. See Procedure Note below for Paragard removal and Mirena insertion.    Risk factors identified in Subjective to review: none No orders of the defined types were placed in this encounter.  Current Outpatient Medications  Medication Instructions   PARAGARD INTRAUTERINE COPPER IU Intrauterine     No follow-ups on file.    Subjective:    Kaitlin Bond is a 37 y.o. female G1P1001 who presents for annual wellness visit.   Occupation  Lives with husband and 21 year old daughter .  CONCERNS? Would like to consider contraception option that would stop menstrual bleeding.  Well Woman Visit:  GYN HISTORY:  Patient's last menstrual period was 11/18/2022 (exact date).     Menstrual History: OB History     Gravida  1   Para  1   Term  1   Preterm      AB      Living  1      SAB      IAB      Ectopic      Multiple      Live Births  1           Patient's last menstrual period was  11/18/2022 (exact date). Period Cycle (Days): 28 Period Duration (Days): 3-5 Period Pattern: Regular Dysmenorrhea: None Urinary incontinence? Np Dyspareunia? No  Last pap: 2020, HPV neg, NILM History of abnormal Pap: yes, in 2005, colpo at that time, monitored and normal since that time Gardasil series:  no Sexually active: yes Number of sexual partners: one Gender of sexual Partners: male STI history: no Contraceptive methods: Paragard.  Health Maintenance > Regular self breast exam: reviewed breast self-awareness > History of abnormal mammogram: n/a > Exercise:  HIIT, strength training , very active > Dietary Supplements: Folate: No.;  Calcium: No.; Vitamin D: No. > Body mass index is 27.87 kg/m.  > Recent dental visit Yes.   > Seat Belt Use: Yes.   > Texting and driving? No. > Guns in the house Yes.  , safety locked > STI/HIV testing or immunizations needed? No. > Concern for alcohol abuse? Has cut back recently, 1-2 glasses of wine per week    IPV: feels safe with current partner.  Tobacco Use: Low Risk  (12/08/2022)   Patient History    Smoking Tobacco Use: Never    Smokeless Tobacco Use: Never    Passive Exposure: Not on file     PHQ-2 Score: 0  _________________________________________________________  Current Outpatient Medications  Medication Sig Dispense Refill   PARAGARD INTRAUTERINE COPPER  IU by Intrauterine route.     No current facility-administered medications for this visit.   Allergies  Allergen Reactions   Azithromycin     Makes patient "feel funny", strange feeling.   Other     Gluten intolerance    History reviewed. No pertinent past medical history. Past Surgical History:  Procedure Laterality Date   BREAST REDUCTION SURGERY     CESAREAN SECTION     OB History     Gravida  1   Para  1   Term  1   Preterm      AB      Living  1      SAB      IAB      Ectopic      Multiple      Live Births  1           Social History   Tobacco Use   Smoking status: Never   Smokeless tobacco: Never  Substance Use Topics   Alcohol use: No   Social History   Substance and Sexual Activity  Sexual Activity Yes   Birth control/protection: I.U.D.    Immunization History  Administered Date(s) Administered   Influenza,inj,Quad PF,6+ Mos 04/25/2019   Influenza-Unspecified 05/03/2017, 04/19/2018, 03/26/2020     Review Of Systems  ROS Constitutional: Denied constitutional symptoms, night sweats, recent illness, fatigue, fever, insomnia and weight loss.  Eyes: Denied eye symptoms, eye pain, photophobia, vision change and visual disturbance.  Ears/Nose/Throat/Neck: Denied ear, nose, throat or neck symptoms, hearing loss, nasal discharge, sinus congestion and sore throat.  Cardiovascular: Denied cardiovascular symptoms, arrhythmia, chest pain/pressure, edema, exercise intolerance, orthopnea and palpitations.  Respiratory: Denied pulmonary symptoms, asthma, pleuritic pain, productive sputum, cough, dyspnea and wheezing.  Gastrointestinal: Denied, gastro-esophageal reflux, melena, nausea and vomiting.  Genitourinary: Denied genitourinary symptoms including symptomatic vaginal discharge, pelvic relaxation issues, and urinary complaints.  Musculoskeletal: Denied musculoskeletal symptoms, stiffness, swelling, muscle weakness and myalgia.  Dermatologic: Denied dermatology symptoms, rash and scar.  Neurologic: Denied neurology symptoms, dizziness, headache, neck pain and syncope.  Psychiatric: Denied psychiatric symptoms, anxiety and depression.  Endocrine: Denied endocrine symptoms including hot flashes and night sweats.      Objective:    BP 111/73   Pulse 66   Resp 16   Ht 5\' 8"  (1.727 m)   Wt 183 lb 4.8 oz (83.1 kg)   LMP 11/18/2022 (Exact Date)   BMI 27.87 kg/m   Constitutional: Well-developed, well-nourished female in no acute distress Neurological: Alert and oriented to person, place, and  time Psychiatric: Mood and affect appropriate Skin: No rashes or lesions Neck: Supple without masses. Trachea is midline.Thyroid is normal size without masses Lymphatics: No cervical, axillary, supraclavicular, or inguinal adenopathy noted Respiratory: Clear to auscultation bilaterally. Good air movement with normal work of breathing. Cardiovascular: Regular rate and rhythm. Extremities grossly normal, nontender with no edema; pulses regular Gastrointestinal: Soft, nontender, nondistended. No masses or hernias appreciated. No hepatosplenomegaly. No fluid wave. No rebound or guarding. Breast Exam: normal appearance, no masses or tenderness Genitourinary:         External Genitalia: Normal female genitalia    Vagina: well-rugated, pink, moist, no lesions.    Cervix: No lesions, normal size and consistency; no cervical motion tenderness  Perineum/Anus: No lesions Rectal: deferred   ENCOUNTER FOR IUD REMOVAL AND INSERTION   Subjective  Dymond P Longcore is a 37 y.o. G1P1001 who presents today for IUD insertion. She desires reversible long-term contraception. We have  thoroughly reviewed the risks, benefits, and alternatives, and she has elected to proceed with Paragard removal and Mirena insertion.   Objective BP 111/73   Pulse 66   Resp 16   Ht 5\' 8"  (1.727 m)   Wt 183 lb 4.8 oz (83.1 kg)   LMP 11/18/2022 (Exact Date)   BMI 27.87 kg/m   UPT: n/a (switching from Paragard to [place])  Procedure Note Consent was obtained prior to the procedure. A sterile speculum was placed in the vagina, and the cervix was visualized. The strings of the IUD were grasped and pulled using ring forceps. The IUD was successfully removed in its entirety. Betadine was applied to the cervix. A single-toothed Allis was placed on the anterior lip of the cervix, and gentle traction was applied to straighten and stabilize it. The uterus was sounded to about 8 cm. The IUD was inserted to the appropriate depth and the  insertion tool was removed. The strings were trimmed to about 3 cm. Bleeding was minimal. Patient tolerated the procedure well. Post-procedure care and warning signs were reviewed with patient.  Follow up for annual visit or PRN.  Autumn Messing, CNM

## 2023-08-24 ENCOUNTER — Other Ambulatory Visit: Payer: Self-pay | Admitting: Medical Genetics

## 2023-09-02 ENCOUNTER — Other Ambulatory Visit: Payer: Self-pay

## 2023-09-03 ENCOUNTER — Other Ambulatory Visit: Payer: Self-pay | Attending: Medical Genetics

## 2023-10-17 ENCOUNTER — Other Ambulatory Visit: Payer: Self-pay

## 2023-10-17 DIAGNOSIS — Z1231 Encounter for screening mammogram for malignant neoplasm of breast: Secondary | ICD-10-CM

## 2023-11-03 ENCOUNTER — Ambulatory Visit
Admission: RE | Admit: 2023-11-03 | Discharge: 2023-11-03 | Disposition: A | Discharge status: Home / Self Care | Source: Ambulatory Visit

## 2023-11-03 DIAGNOSIS — Z1231 Encounter for screening mammogram for malignant neoplasm of breast: Secondary | ICD-10-CM

## 2023-11-18 ENCOUNTER — Other Ambulatory Visit

## 2023-11-21 ENCOUNTER — Other Ambulatory Visit
Admission: RE | Admit: 2023-11-21 | Discharge: 2023-11-21 | Disposition: A | Discharge status: Home / Self Care | Payer: Self-pay | Source: Ambulatory Visit | Attending: Medical Genetics | Admitting: Medical Genetics

## 2023-12-02 LAB — GENECONNECT MOLECULAR SCREEN: Genetic Analysis Overall Interpretation: NEGATIVE

## 2023-12-19 ENCOUNTER — Other Ambulatory Visit (HOSPITAL_COMMUNITY)
Admission: RE | Admit: 2023-12-19 | Discharge: 2023-12-19 | Disposition: A | Discharge status: Home / Self Care | Source: Ambulatory Visit

## 2023-12-19 ENCOUNTER — Ambulatory Visit (INDEPENDENT_AMBULATORY_CARE_PROVIDER_SITE_OTHER)

## 2023-12-19 VITALS — BP 104/65 | HR 73 | Ht 68.0 in | Wt 176.0 lb

## 2023-12-19 DIAGNOSIS — Z01419 Encounter for gynecological examination (general) (routine) without abnormal findings: Secondary | ICD-10-CM

## 2023-12-19 DIAGNOSIS — Z0000 Encounter for general adult medical examination without abnormal findings: Secondary | ICD-10-CM | POA: Diagnosis present

## 2023-12-19 NOTE — Progress Notes (Addendum)
 ANNUAL GYNECOLOGICAL EXAM  SUBJECTIVE  HPI  Kaitlin Bond is a 38 y.o.-year-old G1P1001 who presents for an annual gynecological exam today.  She denies pelvic pain, dyspareunia, abnormal vaginal bleeding or discharge, and UTI symptoms. She has a Mirena  IUD, she reports being happy with IUD and reports no concerns.   Medical/Surgical History History reviewed. No pertinent past medical history. Past Surgical History:  Procedure Laterality Date   BREAST REDUCTION SURGERY Bilateral 2007   CESAREAN SECTION     REDUCTION MAMMAPLASTY      Social History Lives with Husband and children. 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 (Menstrual status: IUD). Light spotting monthly due to Mirena   Last Pap: 09/19/2018 Contraception: Mirena  IUD   Prevention Mammogram UTD, discussed normal results and to repeat in one year.     Current Medications Outpatient Medications Prior to Visit  Medication Sig   [DISCONTINUED] PARAGARD  INTRAUTERINE COPPER  IU by Intrauterine route.   Facility-Administered Medications Prior to Visit  Medication Dose Route Frequency Provider   levonorgestrel  (MIRENA ) 20 MCG/DAY IUD 1 each  1 each Intrauterine Once Free, Verita Glassman, CNM      [facility] - 12/19/23 0909       Pregnancy Intention Screening   Does the patient want to become pregnant in the next year? No    Does the patient's partner want to become pregnant in the next year? No    Would the patient like to discuss contraceptive options today? No      Contraception Wrap Up   Current Method IUD or IUS    End Method IUD or IUS    Contraception Counseling Provided No     How was the end contraceptive method provided? N/A              ROS Constitutional: Denied constitutional symptoms, night sweats, recent illness, fatigue, fever, insomnia and weight loss.  Eyes: Denied eye symptoms, eye pain, photophobia, vision change and visual disturbance.  Ears/Nose/Throat/Neck: Denied ear, nose, throat or neck symptoms, hearing loss, nasal discharge, sinus congestion and sore throat.  Cardiovascular: Denied cardiovascular symptoms, arrhythmia, chest pain/pressure, edema, exercise intolerance, orthopnea and palpitations.  Respiratory: Denied pulmonary symptoms, asthma, pleuritic pain, productive sputum, cough, dyspnea and wheezing.  Gastrointestinal: Denied gastro-esophageal reflux, melena, nausea and vomiting.  Genitourinary: Denied genitourinary symptoms including symptomatic vaginal discharge, pelvic relaxation issues, and urinary complaints.  Musculoskeletal: Denied musculoskeletal symptoms, stiffness, swelling, muscle weakness and myalgia.  Dermatologic: Denied dermatology symptoms, rash and scar.  Neurologic: Denied neurology symptoms, dizziness, headache, neck pain and syncope.  Psychiatric: Denied psychiatric symptoms, anxiety and depression.  Endocrine: Denied endocrine symptoms including hot flashes and night sweats.    OBJECTIVE  Ht 5\' 8"  (1.727 m)   Wt 79.8 kg   BMI 26.76 kg/m    Physical examination General NAD, Conversant  HEENT Atraumatic;   Normo-cephalic.   Thyroid/Neck Smooth without nodularity or enlargement. Normal ROM.  Neck Supple.  Skin No rashes, lesions or ulceration. Normal palpated skin turgor. No nodularity.  Breasts: No masses or discharge.  Symmetric.  No axillary adenopathy.  Lungs: Clear to auscultation.No rales or wheezes. Normal Respiratory effort, no retractions.  Heart: NSR.  No murmurs or rubs appreciated. No peripheral edema  Abdomen: Soft.  Non-tender.  No masses.  No HSM. No hernia  Extremities: Moves all  appropriately.  Normal ROM for age. No lymphadenopathy.  Neuro: Oriented to PPT.  Normal mood. Normal affect.     Pelvic:   Vulva: Normal appearance.  No lesions.  Vagina: No lesions or abnormalities noted.  Support: Normal pelvic support.  Urethra No masses tenderness or scarring.  Meatus Normal size without lesions or prolapse.  Cervix: Normal appearance.  No lesions.IUD strings visualized at cervical os.   Anus: Normal exam.  No lesions.  Perineum: Normal exam.  No lesions.        Bimanual deferred  Uterus:   Adnexae:   Cul-de-sac:     ASSESSMENT  1) Annual exam 2) Pap smear.   PLAN 1) Physical exam as noted. Discussed healthy lifestyle choices and preventive care.  2) Pap collected and sent, will f/u with results.   Return in one year for annual exam or as needed for concerns.   Tilman Fonder, SNM

## 2023-12-21 ENCOUNTER — Ambulatory Visit: Payer: Self-pay

## 2023-12-21 LAB — CYTOLOGY - PAP
Adequacy: ABSENT
Comment: NEGATIVE
Diagnosis: NEGATIVE
High risk HPV: NEGATIVE
